# Patient Record
Sex: Female | Born: 1955 | Race: White | Hispanic: Yes | Marital: Married | Smoking: Former smoker
Health system: Southern US, Community
[De-identification: ages and names within clinical notes are randomized; demographics above are authoritative.]

## PROBLEM LIST (undated history)

## (undated) DIAGNOSIS — C44310 Basal cell carcinoma of skin of unspecified parts of face: Secondary | ICD-10-CM

## (undated) DIAGNOSIS — F411 Generalized anxiety disorder: Secondary | ICD-10-CM

## (undated) DIAGNOSIS — M8580 Other specified disorders of bone density and structure, unspecified site: Secondary | ICD-10-CM

## (undated) DIAGNOSIS — I10 Essential (primary) hypertension: Secondary | ICD-10-CM

## (undated) DIAGNOSIS — E785 Hyperlipidemia, unspecified: Secondary | ICD-10-CM

## (undated) DIAGNOSIS — G500 Trigeminal neuralgia: Secondary | ICD-10-CM

## (undated) DIAGNOSIS — E559 Vitamin D deficiency, unspecified: Secondary | ICD-10-CM

## (undated) HISTORY — PX: CATARACT EXTRACTION, BILATERAL: SHX1313

## (undated) HISTORY — DX: Essential (primary) hypertension: I10

## (undated) HISTORY — DX: Other specified disorders of bone density and structure, unspecified site: M85.80

## (undated) HISTORY — DX: Vitamin D deficiency, unspecified: E55.9

## (undated) HISTORY — DX: Hyperlipidemia, unspecified: E78.5

## (undated) HISTORY — DX: Trigeminal neuralgia: G50.0

## (undated) HISTORY — DX: Generalized anxiety disorder: F41.1

## (undated) HISTORY — PX: REDUCTION MAMMAPLASTY: SUR839

## (undated) HISTORY — DX: Basal cell carcinoma of skin of unspecified parts of face: C44.310

---

## 1997-06-29 HISTORY — PX: REDUCTION MAMMAPLASTY: SUR839

## 2005-06-29 HISTORY — PX: TOTAL ABDOMINAL HYSTERECTOMY: SHX209

## 2017-12-22 DIAGNOSIS — E782 Mixed hyperlipidemia: Secondary | ICD-10-CM | POA: Insufficient documentation

## 2018-02-17 LAB — HM HEPATITIS C SCREENING LAB: HM Hepatitis Screen: NEGATIVE

## 2018-03-28 ENCOUNTER — Other Ambulatory Visit: Payer: Self-pay | Admitting: Nurse Practitioner

## 2018-03-28 DIAGNOSIS — Z1231 Encounter for screening mammogram for malignant neoplasm of breast: Secondary | ICD-10-CM

## 2018-04-07 DIAGNOSIS — G5601 Carpal tunnel syndrome, right upper limb: Secondary | ICD-10-CM | POA: Insufficient documentation

## 2018-05-02 ENCOUNTER — Ambulatory Visit
Admission: RE | Admit: 2018-05-02 | Discharge: 2018-05-02 | Disposition: A | Discharge status: Home / Self Care | Payer: 59 | Source: Ambulatory Visit | Attending: Nurse Practitioner | Admitting: Nurse Practitioner

## 2018-05-02 ENCOUNTER — Encounter: Payer: Self-pay | Admitting: Radiology

## 2018-05-02 DIAGNOSIS — Z1231 Encounter for screening mammogram for malignant neoplasm of breast: Secondary | ICD-10-CM

## 2018-11-23 DIAGNOSIS — G500 Trigeminal neuralgia: Secondary | ICD-10-CM | POA: Insufficient documentation

## 2018-11-23 DIAGNOSIS — F411 Generalized anxiety disorder: Secondary | ICD-10-CM | POA: Insufficient documentation

## 2018-11-23 DIAGNOSIS — C44311 Basal cell carcinoma of skin of nose: Secondary | ICD-10-CM | POA: Insufficient documentation

## 2019-01-19 LAB — HM COLONOSCOPY

## 2019-08-25 ENCOUNTER — Other Ambulatory Visit: Payer: Self-pay | Admitting: Nurse Practitioner

## 2019-08-25 DIAGNOSIS — Z1231 Encounter for screening mammogram for malignant neoplasm of breast: Secondary | ICD-10-CM

## 2019-09-15 ENCOUNTER — Ambulatory Visit: Payer: Self-pay | Attending: Internal Medicine

## 2019-09-15 DIAGNOSIS — Z23 Encounter for immunization: Secondary | ICD-10-CM

## 2019-09-15 NOTE — Progress Notes (Signed)
   Covid-19 Vaccination Clinic  Name:  Kelsey Humphrey    MRN: [id number] DOB: 04/22/1956  09/15/2019  [name] was observed post Covid-19 immunization for 15 minutes without incident. She was provided with Vaccine Information Sheet and instruction to access the V-Safe system.   [name] was instructed to call 911 with any severe reactions post vaccine: Marland Kitchen Difficulty breathing  . Swelling of face and throat  . A fast heartbeat  . A bad rash all over body  . Dizziness and weakness   Immunizations Administered    Name Date Dose VIS Date Route   Pfizer COVID-19 Vaccine 09/15/2019  4:06 PM 0.3 mL 06/09/2019 Intramuscular   Manufacturer: [place]   Lot: [id number]   [place]: [id number]

## 2019-09-27 ENCOUNTER — Ambulatory Visit: Payer: 59

## 2019-12-26 ENCOUNTER — Ambulatory Visit
Admission: RE | Admit: 2019-12-26 | Discharge: 2019-12-26 | Disposition: A | Discharge status: Home / Self Care | Payer: 59 | Source: Ambulatory Visit | Attending: Nurse Practitioner | Admitting: Nurse Practitioner

## 2019-12-26 ENCOUNTER — Other Ambulatory Visit: Payer: Self-pay

## 2019-12-26 DIAGNOSIS — Z1231 Encounter for screening mammogram for malignant neoplasm of breast: Secondary | ICD-10-CM

## 2019-12-29 ENCOUNTER — Other Ambulatory Visit: Payer: Self-pay | Admitting: Nurse Practitioner

## 2019-12-29 DIAGNOSIS — R928 Other abnormal and inconclusive findings on diagnostic imaging of breast: Secondary | ICD-10-CM

## 2020-01-03 ENCOUNTER — Other Ambulatory Visit: Payer: Self-pay

## 2020-01-03 ENCOUNTER — Ambulatory Visit
Admission: RE | Admit: 2020-01-03 | Discharge: 2020-01-03 | Disposition: A | Discharge status: Home / Self Care | Payer: 59 | Source: Ambulatory Visit | Attending: Nurse Practitioner | Admitting: Nurse Practitioner

## 2020-01-03 DIAGNOSIS — R928 Other abnormal and inconclusive findings on diagnostic imaging of breast: Secondary | ICD-10-CM

## 2020-01-08 ENCOUNTER — Other Ambulatory Visit: Payer: Self-pay | Admitting: Nurse Practitioner

## 2020-01-08 DIAGNOSIS — M8580 Other specified disorders of bone density and structure, unspecified site: Secondary | ICD-10-CM

## 2020-01-08 DIAGNOSIS — E2839 Other primary ovarian failure: Secondary | ICD-10-CM

## 2020-01-23 ENCOUNTER — Ambulatory Visit
Admission: RE | Admit: 2020-01-23 | Discharge: 2020-01-23 | Disposition: A | Discharge status: Home / Self Care | Payer: 59 | Source: Ambulatory Visit | Attending: Nurse Practitioner | Admitting: Nurse Practitioner

## 2020-01-23 ENCOUNTER — Other Ambulatory Visit: Payer: Self-pay

## 2020-01-23 DIAGNOSIS — E2839 Other primary ovarian failure: Secondary | ICD-10-CM

## 2020-01-23 DIAGNOSIS — M8580 Other specified disorders of bone density and structure, unspecified site: Secondary | ICD-10-CM

## 2020-02-07 ENCOUNTER — Other Ambulatory Visit: Payer: 59

## 2020-11-29 ENCOUNTER — Other Ambulatory Visit: Payer: Self-pay | Admitting: Nurse Practitioner

## 2020-11-29 DIAGNOSIS — N6320 Unspecified lump in the left breast, unspecified quadrant: Secondary | ICD-10-CM

## 2020-12-09 LAB — HM MAMMOGRAPHY

## 2021-01-10 ENCOUNTER — Other Ambulatory Visit: Payer: 59

## 2021-07-12 IMAGING — MG DIGITAL SCREENING BILAT W/ TOMO W/ CAD
8 series · 9 of 24 positions shown · non-contrast
Comparison: Previous exam(s).

CLINICAL DATA: Screening.

EXAM:
DIGITAL SCREENING BILATERAL MAMMOGRAM WITH TOMO AND CAD

[R CC synth-2D]
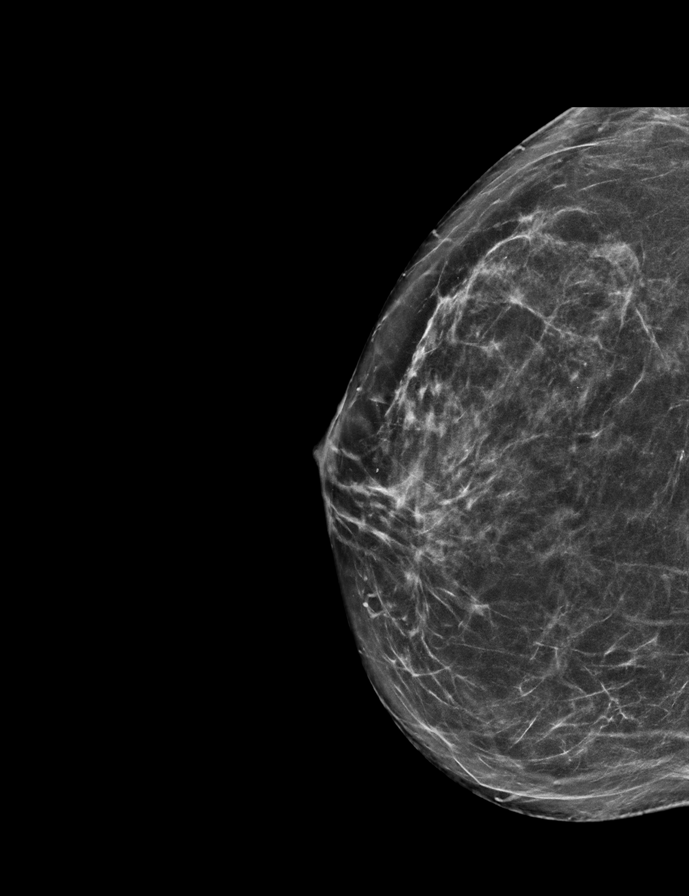

[R MLO synth-2D]
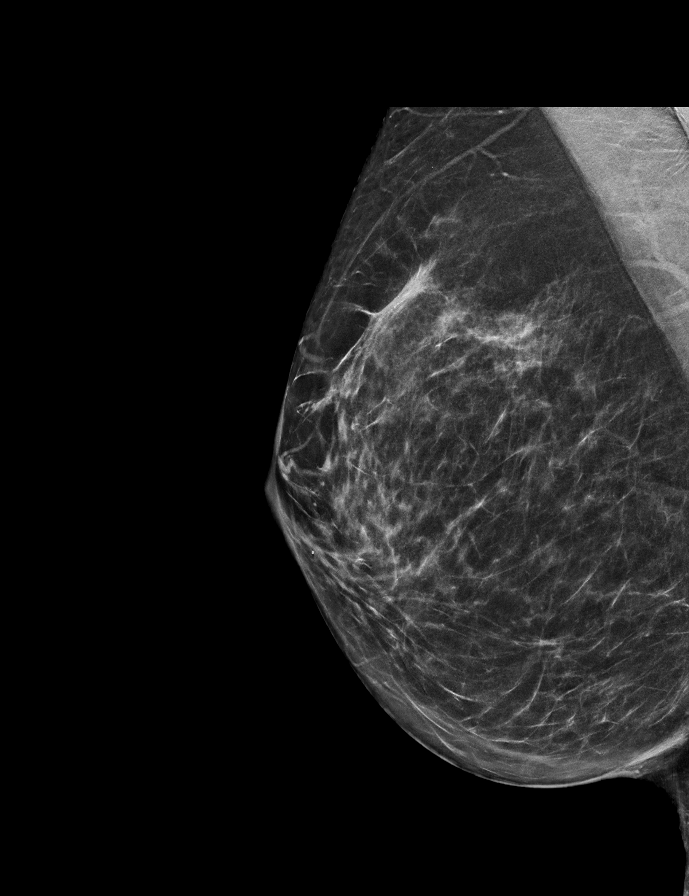

[L MLO synth-2D]
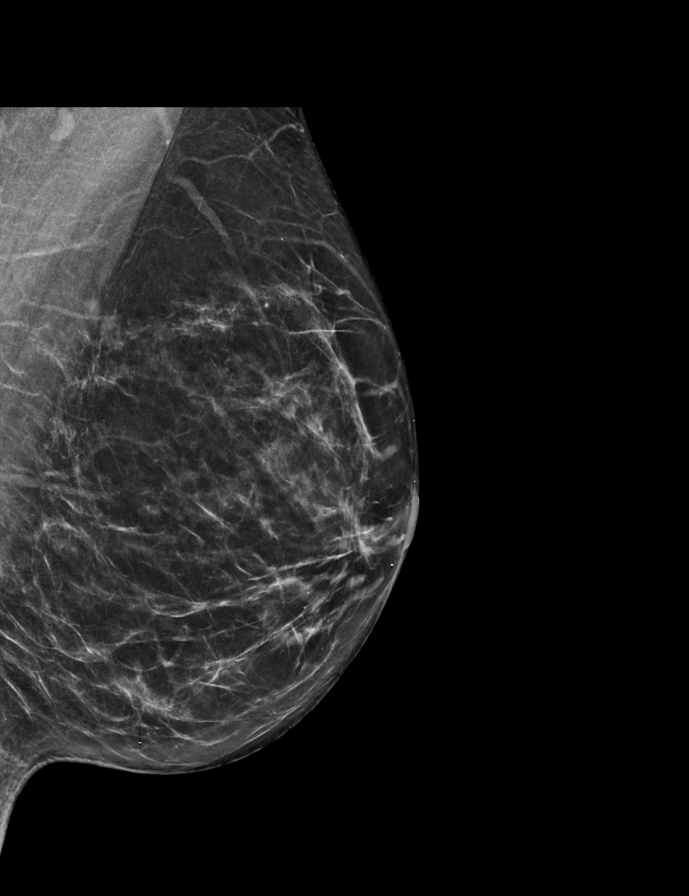

[L CC synth-2D]
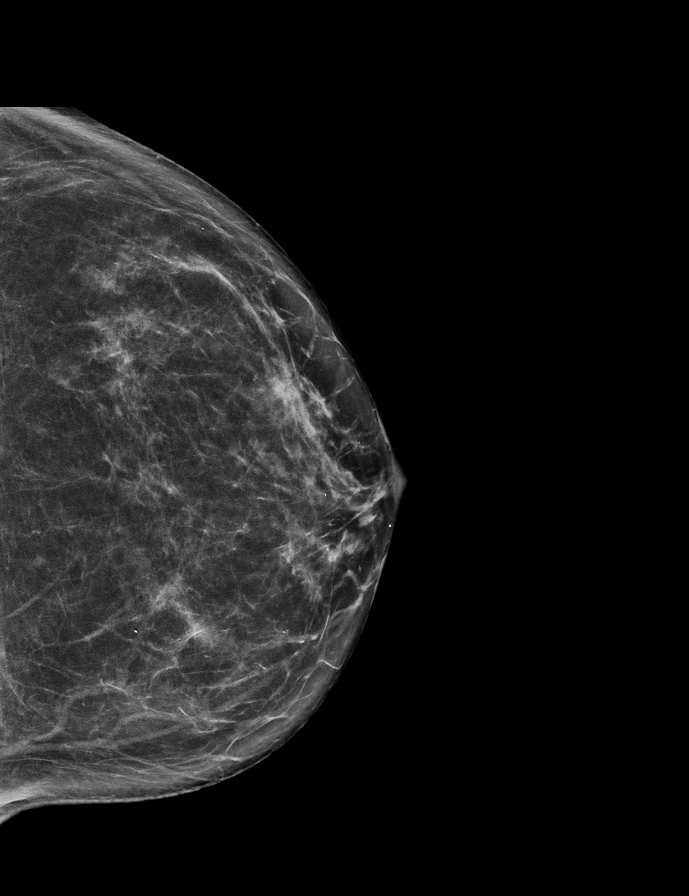

[R MLO tomo · 2 of 61 frames shown]
[frame 20/61]
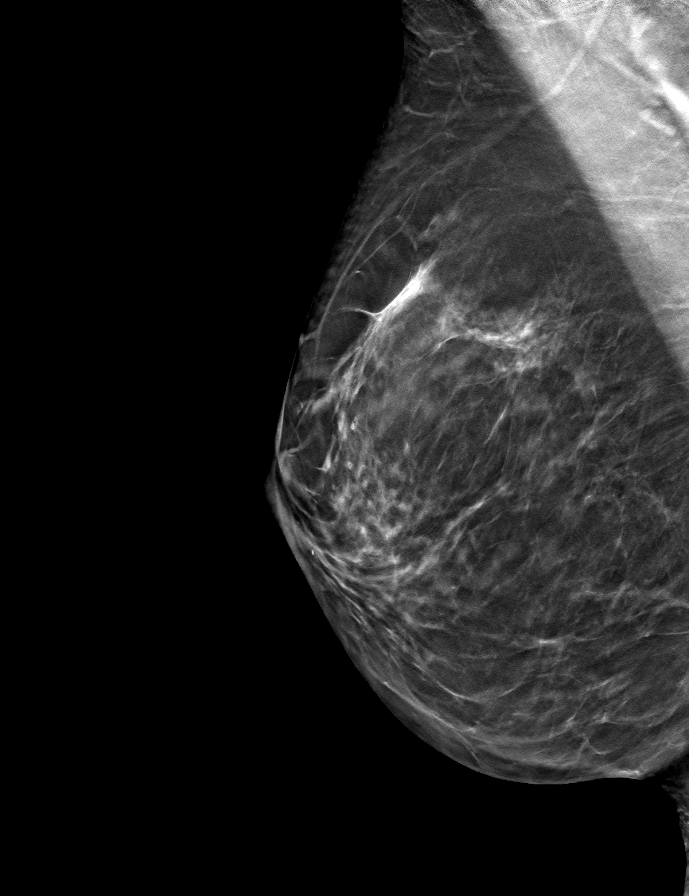
[frame 31/61]
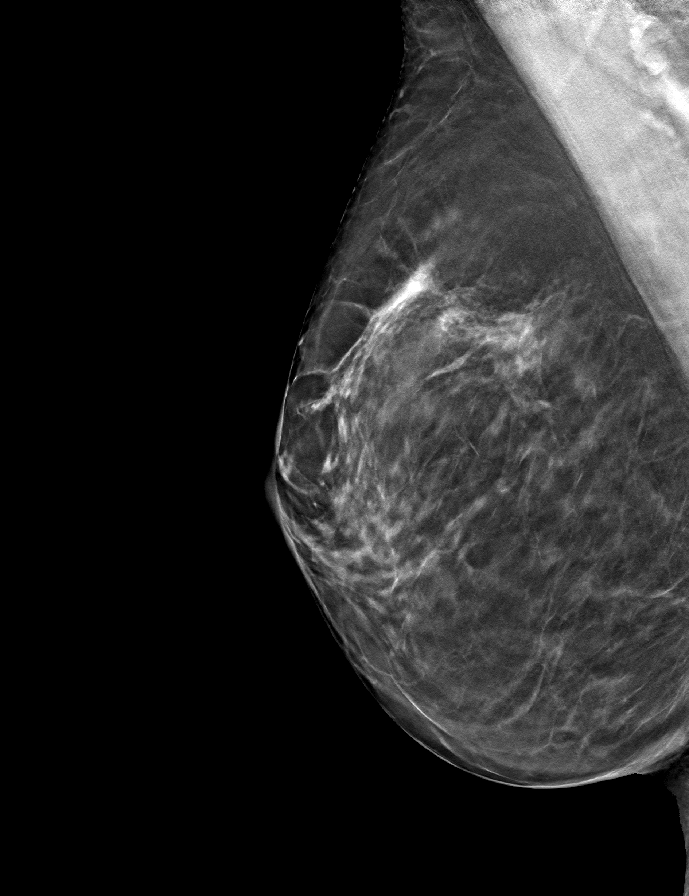

[L CC tomo · tomo slice 33/64.0]
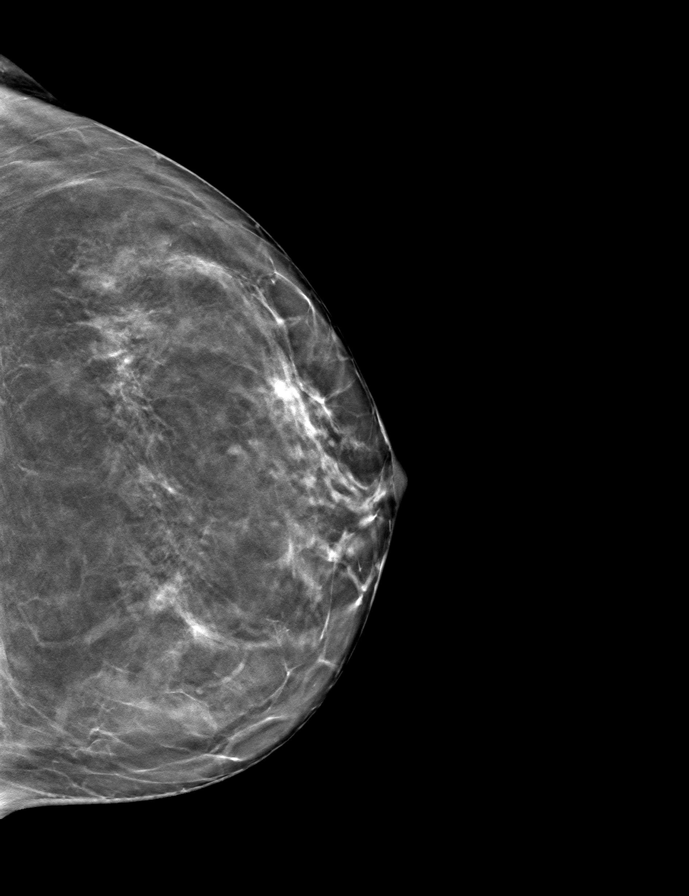

[R CC tomo · tomo slice 33/66.0]
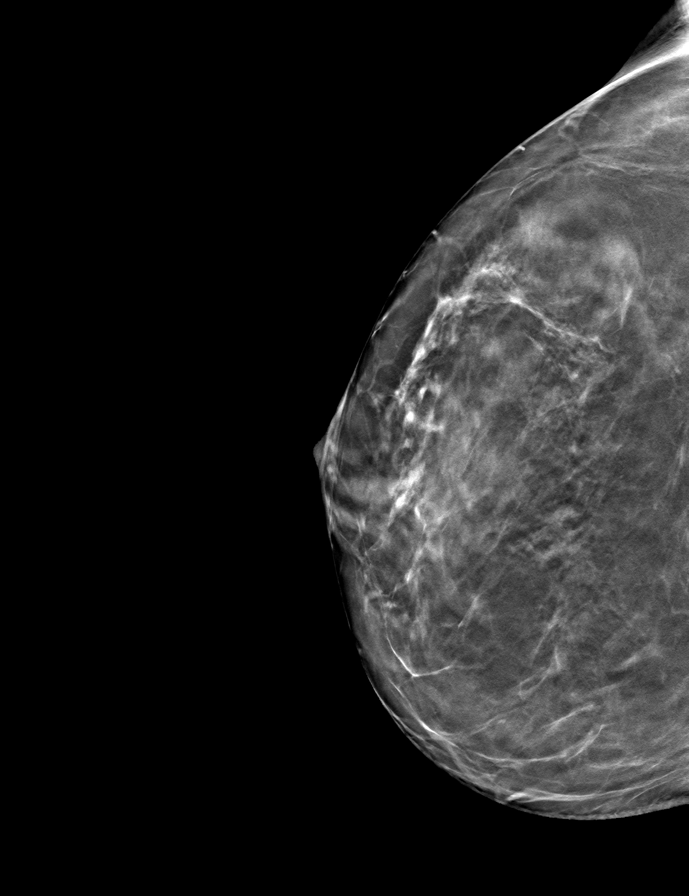

[L MLO tomo · tomo slice 31/62.0]
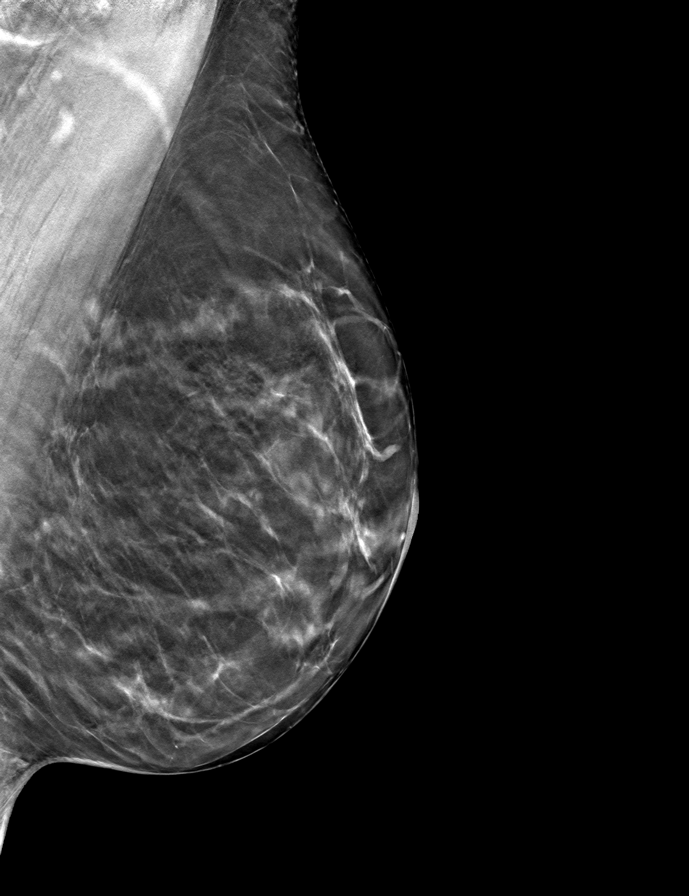

[9 of 24 positions shown; findings below may reference images not displayed]

ACR Breast Density Category b: There are scattered areas of
fibroglandular density.
FINDINGS: In the left breast, a possible mass warrants further evaluation. In
the right breast, no findings suspicious for malignancy.

Images were processed with CAD.
IMPRESSION: Further evaluation is suggested for possible mass in the left
breast.

RECOMMENDATION:
Diagnostic mammogram and possibly ultrasound of the left breast.
(Code:JC-[id number])

The patient will be contacted regarding the findings, and additional
imaging will be scheduled.

BI-RADS CATEGORY  0: Incomplete. Need additional imaging evaluation
and/or prior mammograms for comparison.

## 2021-12-01 ENCOUNTER — Encounter: Payer: Self-pay | Admitting: Nurse Practitioner

## 2021-12-01 ENCOUNTER — Telehealth: Payer: Self-pay | Admitting: Internal Medicine

## 2021-12-01 DIAGNOSIS — E559 Vitamin D deficiency, unspecified: Secondary | ICD-10-CM | POA: Insufficient documentation

## 2021-12-01 NOTE — Telephone Encounter (Signed)
Pt stated her and her husband Rosaria Ferries met Dr.Paz at  friend's house and would like to establish care with him. Please advise.

## 2021-12-01 NOTE — Telephone Encounter (Signed)
Okay to schedule NP appt please.

## 2021-12-01 NOTE — Telephone Encounter (Signed)
Is okay, please arrange.  thanks

## 2021-12-01 NOTE — Telephone Encounter (Signed)
Please advise 

## 2021-12-02 ENCOUNTER — Encounter: Payer: Self-pay | Admitting: Nurse Practitioner

## 2021-12-19 ENCOUNTER — Other Ambulatory Visit: Payer: Self-pay | Admitting: Nurse Practitioner

## 2021-12-19 DIAGNOSIS — Z1231 Encounter for screening mammogram for malignant neoplasm of breast: Secondary | ICD-10-CM

## 2021-12-31 ENCOUNTER — Ambulatory Visit: Payer: Commercial Managed Care - HMO | Admitting: Internal Medicine

## 2021-12-31 ENCOUNTER — Encounter: Payer: Self-pay | Admitting: Internal Medicine

## 2021-12-31 VITALS — BP 98/68 | HR 95 | Temp 97.8°F | Resp 16 | Ht 62.0 in | Wt 135.8 lb

## 2021-12-31 DIAGNOSIS — F411 Generalized anxiety disorder: Secondary | ICD-10-CM

## 2021-12-31 DIAGNOSIS — K5909 Other constipation: Secondary | ICD-10-CM

## 2021-12-31 DIAGNOSIS — G500 Trigeminal neuralgia: Secondary | ICD-10-CM

## 2021-12-31 DIAGNOSIS — Z124 Encounter for screening for malignant neoplasm of cervix: Secondary | ICD-10-CM

## 2021-12-31 DIAGNOSIS — E782 Mixed hyperlipidemia: Secondary | ICD-10-CM

## 2021-12-31 DIAGNOSIS — E559 Vitamin D deficiency, unspecified: Secondary | ICD-10-CM | POA: Diagnosis not present

## 2021-12-31 DIAGNOSIS — Z1231 Encounter for screening mammogram for malignant neoplasm of breast: Secondary | ICD-10-CM

## 2021-12-31 MED ORDER — CITALOPRAM HYDROBROMIDE 20 MG PO TABS: 20.0000 mg | ORAL_TABLET | Freq: Every day | ORAL | 1 refills | Status: DC

## 2021-12-31 NOTE — Patient Instructions (Addendum)
Continue same medications  Start Metamucil 1 capsule daily  Okay to take MiraLAX 17 g with fluids daily if needed  Vitamin D: 2000 units daily  We are referring you for a mammogram at [facility]  We are referring you to a gynecologist in this building.  Vaccines I recommend: Shingrix COVID booster Flu shot every fall       GO TO THE FRONT DESK, [place] back for a physical exam in 4 months

## 2021-12-31 NOTE — Progress Notes (Addendum)
Subjective:    Patient ID: Charleston Vierling, female    DOB: April 20, 1956, 66 y.o.   MRN: [id number]  DOS:  12/31/2021 Type of visit - description: New patient, to get established  In general feeling well. Has a long history of constipation.  MiraLAX okay?.  Review of Systems Other than above, no major concerns  Past Medical History:  Diagnosis Date   BCC (basal cell carcinoma), face    GAD (generalized anxiety disorder)    Hyperlipidemia    Osteopenia    Trigeminal neuralgia    Vitamin D deficiency     Past Surgical History:  Procedure Laterality Date   CATARACT EXTRACTION, BILATERAL Bilateral    CESAREAN SECTION  1990   REDUCTION MAMMAPLASTY Bilateral 1999   TOTAL ABDOMINAL HYSTERECTOMY  2007   Social History   Socioeconomic History   Marital status: Married    Spouse name: Not on file   Number of children: 2   Years of education: Not on file   Highest education level: Not on file  Occupational History   Occupation: retired - Press photographer  Tobacco Use   Smoking status: Former    Types: Cigarettes   Smokeless tobacco: Never   Tobacco comments:    Remote smoker , << 1 ppd   Substance and Sexual Activity   Alcohol use: Yes   Drug use: Not on file   Sexual activity: Not on file  Other Topics Concern   Not on file  Social History Narrative   Original from [place], moves to the [place] 1980s   2 adult children   Social Determinants of Health   Financial Resource Strain: Not on file  Food Insecurity: Not on file  Transportation Needs: Not on file  Physical Activity: Not on file  Stress: Not on file  Social Connections: Not on file  Intimate Partner Violence: Not on file   Family History  Problem Relation Age of Onset   Cerebral aneurysm Mother    Throat cancer Father    Breast cancer Neg Hx    Colon cancer Neg Hx      Current Outpatient Medications  Medication Instructions   atorvastatin (LIPITOR) 40 MG tablet 1 tablet, Oral, Daily   Calcium 600-5 MG-MCG  TABS Oral   citalopram (CELEXA) 20 mg, Oral, Daily   clorazepate (TRANXENE) 7.5 MG tablet 1 tablet, Oral, 2 times daily PRN   estradiol (ESTRACE) 0.1 MG/GM vaginal cream Vaginal   gabapentin (NEURONTIN) 100 MG capsule 1 capsule, Oral, 3 times daily   vitamin C 1,000 mg, Oral, Daily       Objective:   Physical Exam BP 98/68 (BP Location: Left Arm, Patient Position: Sitting, Cuff Size: Normal)   Pulse 95   Temp 97.8 F (36.6 C) (Oral)   Resp 16   Ht '5\' 2"'$  (1.575 m)   Wt 135 lb 12.8 oz (61.6 kg)   SpO2 96%   BMI 24.84 kg/m  General: Well developed, NAD, BMI noted Neck: No  thyromegaly  HEENT:  Normocephalic . Face symmetric, atraumatic Lungs:  CTA B Normal respiratory effort, no intercostal retractions, no accessory muscle use. Heart: RRR,  no murmur.  Abdomen:  Not distended, soft, non-tender. No rebound or rigidity.   Lower extremities: no pretibial edema bilaterally  Skin: Exposed areas without rash. Not pale. Not jaundice Neurologic:  alert & oriented X3.  Speech normal, gait appropriate for age and unassisted Strength symmetric and appropriate for age.  Psych: Cognition and judgment appear intact.  Cooperative  with normal attention span and concentration.  Behavior appropriate. No anxious or depressed appearing.     Assessment    ASSESSMENT High cholesterol Anxiety  HAs, trigeminal neuralgia (per OV note 03/15/2020)- gabapentin Osteopenia Low Vit D Chronic constipation (GI Dr Benson [place], Rx Linzess) Menopausal BCC  sees derm    PLAN New patient, here to get established High cholesterol: On atorvastatin, last LDL 83 in November 2022 Anxiety: Symptoms well controlled. - Refill citalopram today. - Refill Tranxene when needed, PDMP checked today and okay. Chronic constipation: At some point took [place] but now price is extremely high.  We talk about what hydration, Metamucil and okay to take MiraLAX daily if needed. H/o headaches, trigeminal neuralgia: Good  response to gabapentin, refill as needed. H/o low vitamin D, on supplements, last vitamin D 02-2020 was okay. Osteopenia: On vitamin D, reassess on RTC. Preventive care: Had Shingrix No. 1, rec to get 2nd shot @ the pharmacy Mammogram order sent ([facility]) Refer to gynecology. Flu and COVID-vaccine recommended.   Had a colonoscopy few years ago, next in 7 years, per patient she will be due ~ 2027. RTC 4 months CPX  Time spent reviewing the chart (multiple records on Care Everywhere) 35 minutes.

## 2022-01-01 ENCOUNTER — Encounter: Payer: Self-pay | Admitting: Internal Medicine

## 2022-01-01 DIAGNOSIS — Z09 Encounter for follow-up examination after completed treatment for conditions other than malignant neoplasm: Secondary | ICD-10-CM | POA: Insufficient documentation

## 2022-01-01 DIAGNOSIS — Z85828 Personal history of other malignant neoplasm of skin: Secondary | ICD-10-CM | POA: Insufficient documentation

## 2022-01-01 DIAGNOSIS — K5909 Other constipation: Secondary | ICD-10-CM | POA: Insufficient documentation

## 2022-01-01 NOTE — Assessment & Plan Note (Signed)
New patient, here to get established High cholesterol: On atorvastatin, last LDL 83 in November 2022 Anxiety: Symptoms well controlled. - Refill citalopram today. - Refill Tranxene when needed, PDMP checked today and okay. Chronic constipation: At some point took [place] but now price is extremely high.  We talk about what hydration, Metamucil and okay to take MiraLAX daily if needed. H/o headaches, trigeminal neuralgia: Good response to gabapentin, refill as needed. H/o low vitamin D, on supplements, last vitamin D 02-2020 was okay. Osteopenia: On vitamin D, reassess on RTC. Preventive care: Had Shingrix No. 1, rec to get 2nd shot @ the pharmacy Mammogram order sent ([facility]) Refer to gynecology. Flu and COVID-vaccine recommended.   Had a colonoscopy few years ago, next in 7 years, per patient she will be due ~ 2027. RTC 4 months CPX

## 2022-01-08 ENCOUNTER — Other Ambulatory Visit (HOSPITAL_COMMUNITY): Payer: Self-pay | Admitting: Nurse Practitioner

## 2022-01-08 DIAGNOSIS — Z1231 Encounter for screening mammogram for malignant neoplasm of breast: Secondary | ICD-10-CM

## 2022-01-19 ENCOUNTER — Ambulatory Visit (HOSPITAL_COMMUNITY): Payer: Commercial Managed Care - HMO

## 2022-01-22 ENCOUNTER — Other Ambulatory Visit (HOSPITAL_COMMUNITY): Payer: Self-pay | Admitting: Nurse Practitioner

## 2022-01-22 ENCOUNTER — Inpatient Hospital Stay
Admission: RE | Admit: 2022-01-22 | Discharge: 2022-01-22 | Disposition: A | Discharge status: Home / Self Care | Payer: Self-pay | Source: Ambulatory Visit | Attending: Nurse Practitioner | Admitting: Nurse Practitioner

## 2022-01-22 ENCOUNTER — Ambulatory Visit
Admission: RE | Admit: 2022-01-22 | Discharge: 2022-01-22 | Disposition: A | Discharge status: Home / Self Care | Payer: Self-pay | Source: Ambulatory Visit | Attending: Nurse Practitioner | Admitting: Nurse Practitioner

## 2022-01-22 DIAGNOSIS — Z1231 Encounter for screening mammogram for malignant neoplasm of breast: Secondary | ICD-10-CM

## 2022-01-23 ENCOUNTER — Telehealth: Payer: Self-pay | Admitting: Internal Medicine

## 2022-01-23 DIAGNOSIS — M8580 Other specified disorders of bone density and structure, unspecified site: Secondary | ICD-10-CM

## 2022-01-23 NOTE — Telephone Encounter (Signed)
Please advise? She is scheduled for mammogram on 01/28/22- she would like to try to do bone density at the same time.

## 2022-01-23 NOTE — Telephone Encounter (Signed)
Patient called to request order for bone density test. She wants to try to schedule them at the same time. Please call patient to advise.

## 2022-01-23 NOTE — Telephone Encounter (Signed)
Last DEXA 2 years ago, okay to add a bone density takes, DX osteopenia

## 2022-01-23 NOTE — Telephone Encounter (Signed)
Order placed to [facility].

## 2022-01-28 ENCOUNTER — Ambulatory Visit (HOSPITAL_COMMUNITY)
Admission: RE | Admit: 2022-01-28 | Discharge: 2022-01-28 | Disposition: A | Discharge status: Home / Self Care | Payer: Commercial Managed Care - HMO | Source: Ambulatory Visit | Attending: Internal Medicine | Admitting: Internal Medicine

## 2022-01-28 ENCOUNTER — Ambulatory Visit (HOSPITAL_COMMUNITY)
Admission: RE | Admit: 2022-01-28 | Discharge: 2022-01-28 | Disposition: A | Discharge status: Home / Self Care | Payer: Commercial Managed Care - HMO | Source: Ambulatory Visit | Attending: Nurse Practitioner | Admitting: Nurse Practitioner

## 2022-01-28 DIAGNOSIS — Z1231 Encounter for screening mammogram for malignant neoplasm of breast: Secondary | ICD-10-CM | POA: Insufficient documentation

## 2022-01-28 DIAGNOSIS — M8580 Other specified disorders of bone density and structure, unspecified site: Secondary | ICD-10-CM | POA: Insufficient documentation

## 2022-01-29 ENCOUNTER — Other Ambulatory Visit (HOSPITAL_COMMUNITY): Payer: Commercial Managed Care - HMO

## 2022-01-30 ENCOUNTER — Other Ambulatory Visit (HOSPITAL_COMMUNITY): Payer: Self-pay | Admitting: Nurse Practitioner

## 2022-02-03 ENCOUNTER — Other Ambulatory Visit (HOSPITAL_COMMUNITY): Payer: Self-pay | Admitting: Nurse Practitioner

## 2022-02-05 ENCOUNTER — Other Ambulatory Visit (HOSPITAL_COMMUNITY): Payer: Self-pay | Admitting: Nurse Practitioner

## 2022-02-05 DIAGNOSIS — R928 Other abnormal and inconclusive findings on diagnostic imaging of breast: Secondary | ICD-10-CM

## 2022-02-25 ENCOUNTER — Encounter (HOSPITAL_COMMUNITY): Payer: Commercial Managed Care - HMO

## 2022-02-25 ENCOUNTER — Encounter (HOSPITAL_COMMUNITY): Payer: Self-pay

## 2022-02-25 ENCOUNTER — Other Ambulatory Visit (HOSPITAL_COMMUNITY): Payer: Commercial Managed Care - HMO

## 2022-02-25 ENCOUNTER — Ambulatory Visit (HOSPITAL_COMMUNITY): Payer: Commercial Managed Care - HMO

## 2022-03-13 ENCOUNTER — Telehealth: Payer: Self-pay | Admitting: Internal Medicine

## 2022-03-13 NOTE — Telephone Encounter (Signed)
OV note faxed. I have asked for their notes and Pt's cscope report in return.

## 2022-03-13 NOTE — Telephone Encounter (Signed)
Lattie Haw with [facility] calling to get last office visit note for patient with Dr. Larose Kells faxed to [phone].

## 2022-03-25 ENCOUNTER — Encounter: Payer: Self-pay | Admitting: Internal Medicine

## 2022-03-26 ENCOUNTER — Encounter: Payer: Self-pay | Admitting: Internal Medicine

## 2022-03-27 MED ORDER — GABAPENTIN 100 MG PO CAPS: 100.0000 mg | ORAL_CAPSULE | Freq: Three times a day (TID) | ORAL | 1 refills | Status: DC

## 2022-03-27 MED ORDER — CLORAZEPATE DIPOTASSIUM 7.5 MG PO TABS: 7.5000 mg | ORAL_TABLET | Freq: Two times a day (BID) | ORAL | 0 refills | Status: DC | PRN

## 2022-03-27 MED ORDER — ATORVASTATIN CALCIUM 40 MG PO TABS: 40.0000 mg | ORAL_TABLET | Freq: Every day | ORAL | 1 refills | Status: DC

## 2022-03-27 NOTE — Telephone Encounter (Signed)
I have pended the clorazepate prescription. I don't see where you have ever written it for this Pt. Please advise.

## 2022-03-27 NOTE — Telephone Encounter (Signed)
PDMP okay, Rx sent 

## 2022-03-30 ENCOUNTER — Encounter: Payer: Self-pay | Admitting: Internal Medicine

## 2022-05-12 ENCOUNTER — Encounter (HOSPITAL_BASED_OUTPATIENT_CLINIC_OR_DEPARTMENT_OTHER): Payer: Commercial Managed Care - HMO | Admitting: Medical

## 2022-05-14 ENCOUNTER — Encounter: Payer: Commercial Managed Care - HMO | Admitting: Internal Medicine

## 2022-07-27 ENCOUNTER — Ambulatory Visit: Payer: Commercial Managed Care - HMO | Admitting: Internal Medicine

## 2022-08-01 ENCOUNTER — Telehealth: Payer: Self-pay | Admitting: Internal Medicine

## 2022-08-02 ENCOUNTER — Encounter: Payer: Self-pay | Admitting: Internal Medicine

## 2022-08-03 NOTE — Telephone Encounter (Signed)
Requesting: clorazepate 7.'5mg'$   Contract: None UDS: None Last Visit: 12/31/21 Next Visit: 08/11/22 Last Refill: 03/27/22 #90 and 7KC  Will get UDS and contract at next OV  Please Advise

## 2022-08-03 NOTE — Telephone Encounter (Signed)
PDMP okay, Rx sent 

## 2022-08-11 ENCOUNTER — Ambulatory Visit
Admission: RE | Admit: 2022-08-11 | Discharge: 2022-08-11 | Disposition: A | Discharge status: Home / Self Care | Payer: Medicare Other | Source: Ambulatory Visit | Attending: Nurse Practitioner | Admitting: Nurse Practitioner

## 2022-08-11 ENCOUNTER — Encounter: Payer: Self-pay | Admitting: Internal Medicine

## 2022-08-11 ENCOUNTER — Ambulatory Visit (INDEPENDENT_AMBULATORY_CARE_PROVIDER_SITE_OTHER): Payer: Medicare Other | Admitting: Internal Medicine

## 2022-08-11 ENCOUNTER — Ambulatory Visit
Admission: RE | Admit: 2022-08-11 | Discharge: 2022-08-11 | Disposition: A | Discharge status: Home / Self Care | Payer: PRIVATE HEALTH INSURANCE | Source: Ambulatory Visit | Attending: Nurse Practitioner | Admitting: Nurse Practitioner

## 2022-08-11 VITALS — BP 108/62 | HR 58 | Temp 98.0°F | Resp 16 | Ht 62.0 in | Wt 137.0 lb

## 2022-08-11 DIAGNOSIS — R928 Other abnormal and inconclusive findings on diagnostic imaging of breast: Secondary | ICD-10-CM

## 2022-08-11 DIAGNOSIS — M25572 Pain in left ankle and joints of left foot: Secondary | ICD-10-CM

## 2022-08-11 DIAGNOSIS — E559 Vitamin D deficiency, unspecified: Secondary | ICD-10-CM | POA: Diagnosis not present

## 2022-08-11 DIAGNOSIS — M25571 Pain in right ankle and joints of right foot: Secondary | ICD-10-CM

## 2022-08-11 DIAGNOSIS — E782 Mixed hyperlipidemia: Secondary | ICD-10-CM

## 2022-08-11 DIAGNOSIS — Z124 Encounter for screening for malignant neoplasm of cervix: Secondary | ICD-10-CM

## 2022-08-11 DIAGNOSIS — K5909 Other constipation: Secondary | ICD-10-CM | POA: Diagnosis not present

## 2022-08-11 DIAGNOSIS — Z23 Encounter for immunization: Secondary | ICD-10-CM | POA: Diagnosis not present

## 2022-08-11 LAB — COMPREHENSIVE METABOLIC PANEL WITH GFR
ALT: 24 U/L (ref 0–35)
AST: 25 U/L (ref 0–37)
Albumin: 4.3 g/dL (ref 3.5–5.2)
Alkaline Phosphatase: 60 U/L (ref 39–117)
BUN: 18 mg/dL (ref 6–23)
CO2: 31 meq/L (ref 19–32)
Calcium: 9.5 mg/dL (ref 8.4–10.5)
Chloride: 101 meq/L (ref 96–112)
Creatinine, Ser: 0.93 mg/dL (ref 0.40–1.20)
GFR: 64.7 mL/min
Glucose, Bld: 82 mg/dL (ref 70–99)
Potassium: 4.8 meq/L (ref 3.5–5.1)
Sodium: 138 meq/L (ref 135–145)
Total Bilirubin: 0.8 mg/dL (ref 0.2–1.2)
Total Protein: 6.5 g/dL (ref 6.0–8.3)

## 2022-08-11 LAB — LIPID PANEL
Cholesterol: 185 mg/dL (ref 0–200)
HDL: 76.7 mg/dL (ref >39.00)
LDL Cholesterol: 94 mg/dL (ref 0–99)
NonHDL: 108.13
Total CHOL/HDL Ratio: 2
Triglycerides: 72 mg/dL (ref 0.0–149.0)
VLDL: 14.4 mg/dL (ref 0.0–40.0)

## 2022-08-11 LAB — CBC WITH DIFFERENTIAL/PLATELET
Basophils Absolute: 0 10*3/uL (ref 0.0–0.1)
Basophils Relative: 0.7 % (ref 0.0–3.0)
Eosinophils Absolute: 0.1 10*3/uL (ref 0.0–0.7)
Eosinophils Relative: 1.6 % (ref 0.0–5.0)
HCT: 37.8 % (ref 36.0–46.0)
Hemoglobin: 12.5 g/dL (ref 12.0–15.0)
Lymphocytes Relative: 20.7 % (ref 12.0–46.0)
Lymphs Abs: 1.2 10*3/uL (ref 0.7–4.0)
MCHC: 33 g/dL (ref 30.0–36.0)
MCV: 91.4 fl (ref 78.0–100.0)
Monocytes Absolute: 0.6 10*3/uL (ref 0.1–1.0)
Monocytes Relative: 9.7 % (ref 3.0–12.0)
Neutro Abs: 3.9 10*3/uL (ref 1.4–7.7)
Neutrophils Relative %: 67.3 % (ref 43.0–77.0)
Platelets: 300 10*3/uL (ref 150.0–400.0)
RBC: 4.13 Mil/uL (ref 3.87–5.11)
RDW: 13.5 % (ref 11.5–15.5)
WBC: 5.8 10*3/uL (ref 4.0–10.5)

## 2022-08-11 LAB — VITAMIN D 25 HYDROXY (VIT D DEFICIENCY, FRACTURES): VITD: 28.75 ng/mL — ABNORMAL LOW (ref 30.00–100.00)

## 2022-08-11 LAB — TSH: TSH: 1.47 u[IU]/mL (ref 0.35–5.50)

## 2022-08-11 NOTE — Progress Notes (Unsigned)
   Subjective:    Patient ID: Kelsey Humphrey, female    DOB: 1955/10/23, 67 y.o.   MRN: [id number]  DOS:  08/11/2022 Type of visit - description: rov  Since LOV is doing well. Chronic medical problems evaluated. Reports bilateral ankle pain, mostly around the Achilles tendon, sxs are  every morning, pain is severe at first and as she warms up the pain decreases.  No swelling, redness or warmness. She is very active, denies chest pain or difficulty breathing No GI or GU symptoms.   Review of Systems See above   Past Medical History:  Diagnosis Date   BCC (basal cell carcinoma), face    GAD (generalized anxiety disorder)    Hyperlipidemia    Osteopenia    Trigeminal neuralgia    Vitamin D deficiency     Past Surgical History:  Procedure Laterality Date   CATARACT EXTRACTION, BILATERAL Bilateral    CESAREAN SECTION  1990   REDUCTION MAMMAPLASTY Bilateral 1999   TOTAL ABDOMINAL HYSTERECTOMY  2007    Current Outpatient Medications  Medication Instructions   atorvastatin (LIPITOR) 40 mg, Oral, Daily   Calcium 600-5 MG-MCG TABS Oral   citalopram (CELEXA) 20 mg, Oral, Daily   clorazepate (TRANXENE) 7.5 mg, Oral, 2 times daily PRN   estradiol (ESTRACE) 0.1 MG/GM vaginal cream Vaginal   gabapentin (NEURONTIN) 100 mg, Oral, 3 times daily   vitamin C 1,000 mg, Oral, Daily       Objective:   Physical Exam BP 108/62   Pulse (!) 58   Temp 98 F (36.7 C) (Oral)   Resp 16   Ht '5\' 2"'$  (1.575 m)   Wt 137 lb (62.1 kg)   SpO2 98%   BMI 25.06 kg/m  General: Well developed, NAD, BMI noted Neck: No  thyromegaly  HEENT:  Normocephalic . Face symmetric, atraumatic Lungs:  CTA B Normal respiratory effort, no intercostal retractions, no accessory muscle use. Heart: RRR,  no murmur.  Abdomen:  Not distended, soft, non-tender. No rebound or rigidity.   Lower extremities: no pretibial edema bilaterally.  High arch noted, no TTP at the heel area, slightly TTP at the distal Achilles  tendon Skin: Exposed areas without rash. Not pale. Not jaundice Neurologic:  alert & oriented X3.  Speech normal, gait appropriate for age and unassisted Strength symmetric and appropriate for age.  Psych: Cognition and judgment appear intact.  Cooperative with normal attention span and concentration.  Behavior appropriate. No anxious or depressed appearing.     Assessment   ASSESSMENT High cholesterol Anxiety  HAs, trigeminal neuralgia (per OV note 03/15/2020)- gabapentin Osteopenia Low Vit D Chronic constipation (GI Dr Benson [place], Rx Linzess) Menopausal BCC  sees derm  Osteopenia, T-score -1.6 (August 2023)  PLAN High cholesterol: On Lipitor, checking labs. Anxiety: Well-controlled on citalopram and Tranxene.  UDS and contract today. Headaches, trigeminal neuralgia: Continue gabapentin Low vitamin D, on supplements, checking levels. Chronic constipation: Since the last visit, is taking MiraLAX prn and plans to start fiber supplements. BCC: Reports she saw dermatology 06-2022. Osteopenia: She is active, plays pickle ball daily, on calcium 600 mg and vitamin D supplements. Ankle pain bilaterally: As described above, refer to sports medicine.  Noted high arch, shoe inserts? Preventive care reviewed RTC 6 months

## 2022-08-11 NOTE — Progress Notes (Unsigned)
I,Nikolos Billig,acting as a scribe for Lynne Leader, MD.,have documented all relevant documentation on the behalf of Lynne Leader, MD,as directed by  Lynne Leader, MD while in the presence of Lynne Leader, MD.  Subjective:    CC: Bilateral ankle pain  HPI: Patient is a 67 year old female presenting with bilateral ankle pain x 1-2 months.   Pt locates pain to posterior BILAT ankles.  Ankle swelling: pt denies Aggravates: has started playing pickleball over the past few months. Will have some irritation when running after the ball. Stiffness after prolonged time sitting and with first morning steps.  Treatments tried: ROM in the mornings.  Denies past injury to the achilles or ankle sprains.   Also C/O tightness at posterior LEFT knee.  This is worse with activity.  She notes some pain with rotation while playing pickle ball and athletic activity.  She has a little bit of popping and clicking as well.  Pertinent review of Systems: No fevers or chills  Relevant historical information: Carpal tunnel syndrome right wrist.  Trigeminal neuralgia right side of the face.   Objective:    Vitals:   08/12/22 0902  BP: 102/66  Pulse: 72  SpO2: 99%   General: Well Developed, well nourished, and in no acute distress.   MSK: Right Achilles tendon normal. Tender palpation posterior calcaneus.  Normal foot and ankle motion.  Intact strength.  Left Achilles tendon normal. Tender palpation posterior calcaneus.  Normal foot and ankle motion.  Intact strength.  Left knee: Normal-appearing Nontender. Normal motion and strength.  Some pain with resisted knee extension.  No pain with resisted knee flexion. Positive lateral McMurray's test. Stable ligamentous exam.  Lab and Radiology Results  Diagnostic Limited MSK Ultrasound of: Right Achilles tendon:  No visible tendon tear is present Slightly thickened at tendon insertion with increased vascular activity seen on Doppler. Small retrocalcaneal  bursitis is present. Impression: Achilles tendinitis and retrocalcaneal bursitis  Diagnostic Limited MSK Ultrasound of: Left Achilles tendon Slightly thickened Achilles tendon at insertion. No significant retrocalcaneal bursitis present. No visible tears present. Impression: Achilles tendinitis    X-ray images left knee obtained today personally and independently interpreted Mild patellofemoral DJD.  Minimal medial compartment DJD.  No acute fractures. Await formal radiology review   Impression and Recommendations:    Assessment and Plan: 67 y.o. female with bilateral posterior calcaneus pain thought to be due to Achilles tendinitis and right-sided retrocalcaneal bursitis.  She is a great candidate for trial of conservative management using eccentric exercises.  Home exercise program reviewed with athletic trainer today prior to discharge.  Also recommended night splints for [place] fasciitis which should help.  Additionally she notes some posterior left knee and distal thigh pain.  The pain seems to be concentrated at the posterior lateral corner and could represent a degenerative lateral meniscus tear.  X-ray today is largely benign appearing plan for bit of watchful waiting and consider steroid injection or further evaluation with MRI in the future if needed.  Recheck in 1 month.Marland Kitchen  PDMP not reviewed this encounter. Orders Placed This Encounter  Procedures   [place] LIMITED JOINT SPACE STRUCTURES LOW BILAT    Order Specific Question:   Reason for Exam (SYMPTOM  OR DIAGNOSIS REQUIRED)    Answer:   Bilat achilles pain    Order Specific Question:   Preferred imaging location?    Answer:   [facility]   DG Knee AP/LAT W/[place] Left    Standing Status:   Future  Number of Occurrences:   1    Standing Expiration Date:   09/10/2022    Order Specific Question:   Reason for Exam (SYMPTOM  OR DIAGNOSIS REQUIRED)    Answer:   left knee pain    Order Specific  Question:   Preferred imaging location?    Answer:   [facility]   No orders of the defined types were placed in this encounter.   Discussed warning signs or symptoms. Please see discharge instructions. Patient expresses understanding.   The above documentation has been reviewed and is accurate and complete Lynne Leader, M.D.

## 2022-08-11 NOTE — Patient Instructions (Addendum)
We are referring you to a gynecologist. Please call them at [phone]  vaccines I recommend:  Covid booster Shingrix (shingles) #2 RSV vaccine  Will refer you to sports medicine doctor, if you do not hear from them in few days let us know      GO TO THE LAB : Get the blood work     [place], [place] back for   checkup in 6 months     "[place] of attorney" ,  "Living will" (Advance care planning documents)  If you already have a living will or healthcare power of attorney, is recommended you bring the copy to be scanned in your chart.   The document will be available to all the doctors you see in the system.  Advance care planning is a process that supports adults in  understanding and sharing their preferences regarding future medical care.  The patient's preferences are recorded in documents called Advance Directives and the can be modified at any time while the patient is in full mental capacity.   If you don't have one, please consider create one.      More information at: [email or web address]

## 2022-08-12 ENCOUNTER — Ambulatory Visit (INDEPENDENT_AMBULATORY_CARE_PROVIDER_SITE_OTHER): Payer: Medicare Other

## 2022-08-12 ENCOUNTER — Ambulatory Visit: Payer: Medicare Other

## 2022-08-12 ENCOUNTER — Ambulatory Visit (INDEPENDENT_AMBULATORY_CARE_PROVIDER_SITE_OTHER): Payer: Medicare Other | Admitting: Family Medicine

## 2022-08-12 ENCOUNTER — Encounter: Payer: Self-pay | Admitting: Family Medicine

## 2022-08-12 VITALS — BP 102/66 | HR 72 | Ht 62.0 in | Wt 138.0 lb

## 2022-08-12 DIAGNOSIS — M25562 Pain in left knee: Secondary | ICD-10-CM | POA: Diagnosis not present

## 2022-08-12 DIAGNOSIS — M7660 Achilles tendinitis, unspecified leg: Secondary | ICD-10-CM | POA: Diagnosis not present

## 2022-08-12 DIAGNOSIS — Z0000 Encounter for general adult medical examination without abnormal findings: Secondary | ICD-10-CM | POA: Insufficient documentation

## 2022-08-12 DIAGNOSIS — G8929 Other chronic pain: Secondary | ICD-10-CM

## 2022-08-12 NOTE — Assessment & Plan Note (Signed)
High cholesterol: On Lipitor, checking labs. Anxiety: Well-controlled on citalopram and Tranxene.  UDS and contract today. Headaches, trigeminal neuralgia: Continue gabapentin Low vitamin D, on supplements, checking levels. Chronic constipation: Since the last visit, is taking MiraLAX prn and plans to start fiber supplements. BCC: Reports she saw dermatology 06-2022. Osteopenia: She is active, plays pickle ball daily, on calcium 600 mg and vitamin D supplements. Ankle pain bilaterally: As described above, refer to sports medicine.  Noted high arch, shoe inserts? Preventive care reviewed RTC 6 months

## 2022-08-12 NOTE — Assessment & Plan Note (Signed)
Preventive care reviewed Flu shot today PNM 20: Today RSV recommended COVID-vaccine: rec booster  Recommend Shingrix No. 2 at the pharmacy [facility] 01/28/2022 ([facility]).  Follow-up done today.   Gynecology, pending encouraged to call  Bones: T-score -1.6, August 2023, next 2 to 3 years CCS: C-scope 01/19/2019, follow-up 7 years per pathology report. Healthcare POA: See AVS

## 2022-08-12 NOTE — Patient Instructions (Addendum)
Thank you for coming in today.   Please get an Xray today before you leave   Please complete the exercises that the athletic trainer went over with you:  View at [email or web address] using code: [email or web address]  Night splints  Check back in 1 month

## 2022-08-13 MED ORDER — VITAMIN D (ERGOCALCIFEROL) 1.25 MG (50000 UNIT) PO CAPS: 50000.0000 [IU] | ORAL_CAPSULE | ORAL | 0 refills | Status: DC

## 2022-08-13 NOTE — Addendum Note (Signed)
Addended byDamita Dunnings D on: 08/13/2022 07:51 AM   Modules accepted: Orders

## 2022-08-17 NOTE — Progress Notes (Signed)
Left knee x-ray looks normal to radiology

## 2022-08-31 ENCOUNTER — Encounter: Payer: Commercial Managed Care - HMO | Admitting: Internal Medicine

## 2022-09-09 NOTE — Progress Notes (Deleted)
   I, Kelsey Rowen, PhD, LAT, ATC acting as a scribe for [name], MD.  Kelsey Humphrey is a 67 y.o. female who presents to [facility] [place] today for follow-up bilateral posterior calcaneus pain and left knee pain.  Patient was last seen by Dr. Corey on 08/12/2022 and was taught HEP and advised to use night splints. Plan for watchful waiting w/ L knee. Today, pt reports ***  Dx imaging: 08/12/2022 L knee XR   Pertinent review of systems: ***  Relevant historical information: ***   Exam:  There were no vitals taken for this visit. General: Well Developed, well nourished, and in no acute distress.   MSK: ***    Lab and Radiology Results No results found for this or any previous visit (from the past 72 hour(s)). No results found.     Assessment and Plan: 67 y.o. female with ***   PDMP not reviewed this encounter. No orders of the defined types were placed in this encounter.  No orders of the defined types were placed in this encounter.    Discussed warning signs or symptoms. Please see discharge instructions. Patient expresses understanding.   ***  

## 2022-09-10 ENCOUNTER — Ambulatory Visit: Payer: Medicare Other | Admitting: Family Medicine

## 2022-09-24 NOTE — Progress Notes (Signed)
   Rubin Payor, PhD, LAT, ATC acting as a scribe for Clementeen Graham, MD.  Kelsey Humphrey is a 67 y.o. female who presents to [facility] today for follow-up bilateral posterior calcaneus pain and left knee pain.  Patient was last seen by Dr. Denyse Amass on 08/12/2022 and was taught HEP and advised to use night splints. Plan for watchful waiting w/ L knee. Today, pt reports her feet are feeling a lot better. Pt has been wearing the night splints, but is noncompliant w/ HEP. Her L knee is not bothering her.   Dx imaging: 08/12/2022 L knee XR   Pertinent review of systems: No fevers or chills  Relevant historical information: Trigeminal neuralgia and carpal tunnel syndrome.   Exam:  BP 96/66   Pulse 64   Ht 5\' 2"  (1.575 m)   Wt 137 lb (62.1 kg)   SpO2 98%   BMI 25.06 kg/m  General: Well Developed, well nourished, and in no acute distress.   MSK: Bilateral Achilles tendons are nontender.  Normal foot and ankle motion.  Nonantalgic gait.     Assessment and Plan: 67 y.o. female with significantly improved bilateral Achilles tendinitis.  Pain has not fully resolved but it has significantly improved. Plan to continue home exercise program and continue night splints.  Provided again the handout for the home exercise program.  Check back with me as needed.  Precautions reviewed.    Discussed warning signs or symptoms. Please see discharge instructions. Patient expresses understanding.   The above documentation has been reviewed and is accurate and complete 76, M.D.

## 2022-09-28 ENCOUNTER — Ambulatory Visit (INDEPENDENT_AMBULATORY_CARE_PROVIDER_SITE_OTHER): Payer: Medicare Other | Admitting: Family Medicine

## 2022-09-28 VITALS — BP 96/66 | HR 64 | Ht 62.0 in | Wt 137.0 lb

## 2022-09-28 DIAGNOSIS — M7660 Achilles tendinitis, unspecified leg: Secondary | ICD-10-CM

## 2022-09-28 NOTE — Patient Instructions (Signed)
Thank you for coming in today.   Please complete the exercises that the athletic trainer went over with you:  View at [email or web address] using code: [facility]  Recheck as needed.   There is more to do if needed.

## 2022-10-10 ENCOUNTER — Other Ambulatory Visit: Payer: Self-pay | Admitting: Internal Medicine

## 2022-10-27 ENCOUNTER — Telehealth: Payer: Self-pay | Admitting: Internal Medicine

## 2022-10-27 NOTE — Telephone Encounter (Signed)
Requesting: clorazepate 7.5mg  Contract: None UDS: None Last Visit: 08/11/22 Next Visit: 02/09/23 Last Refill: 08/03/22 #60 and 0RF   Will get UDS and contract at next OV   Please Advise

## 2022-10-27 NOTE — Telephone Encounter (Signed)
Pdmp ok, rx sent  ? ?

## 2022-11-08 ENCOUNTER — Other Ambulatory Visit: Payer: Self-pay | Admitting: Internal Medicine

## 2022-11-24 ENCOUNTER — Telehealth: Payer: Self-pay | Admitting: Internal Medicine

## 2022-11-24 MED ORDER — CITALOPRAM HYDROBROMIDE 20 MG PO TABS: 20.0000 mg | ORAL_TABLET | Freq: Every day | ORAL | 1 refills | Status: DC

## 2022-11-24 NOTE — Telephone Encounter (Signed)
Pt stated she is heading out of town tomorrow (5.29.24) and was wondering about getting this Rx refilled  Prescription Request  11/24/2022  Is this a "Controlled Substance" medicine? No  LOV: 08/11/2022  What is the name of the medication or equipment?   citalopram (CELEXA) 20 MG tablet [[id number]]   Have you contacted your pharmacy to request a refill? No   Which pharmacy would you like this sent to?  [facility] [id number] [address], [place] - [address] [place] [address] Phone: [phone] Fax: [phone]    Patient notified that their request is being sent to the clinical staff for review and that they should receive a response within 2 business days.   Please advise at Mobile [phone] (mobile)

## 2022-11-24 NOTE — Addendum Note (Signed)
Addended byConrad Preble D on: 11/24/2022 12:01 PM   Modules accepted: Orders

## 2022-11-24 NOTE — Telephone Encounter (Signed)
Rx sent 

## 2023-01-07 ENCOUNTER — Encounter (HOSPITAL_BASED_OUTPATIENT_CLINIC_OR_DEPARTMENT_OTHER): Payer: Medicare Other | Admitting: Obstetrics & Gynecology

## 2023-01-27 ENCOUNTER — Encounter (INDEPENDENT_AMBULATORY_CARE_PROVIDER_SITE_OTHER): Payer: Self-pay

## 2023-02-04 ENCOUNTER — Encounter (HOSPITAL_BASED_OUTPATIENT_CLINIC_OR_DEPARTMENT_OTHER): Payer: Self-pay

## 2023-02-04 NOTE — Progress Notes (Unsigned)
Called patient to verify allergies, medication list and medical history. Information was updated. tbw

## 2023-02-09 ENCOUNTER — Ambulatory Visit (INDEPENDENT_AMBULATORY_CARE_PROVIDER_SITE_OTHER): Payer: Medicare Other | Admitting: Obstetrics & Gynecology

## 2023-02-09 ENCOUNTER — Encounter (HOSPITAL_BASED_OUTPATIENT_CLINIC_OR_DEPARTMENT_OTHER): Payer: Self-pay | Admitting: Obstetrics & Gynecology

## 2023-02-09 ENCOUNTER — Ambulatory Visit: Payer: Medicare Other | Admitting: Internal Medicine

## 2023-02-09 VITALS — BP 99/50 | HR 69 | Ht 62.0 in | Wt 139.6 lb

## 2023-02-09 DIAGNOSIS — N952 Postmenopausal atrophic vaginitis: Secondary | ICD-10-CM

## 2023-02-09 DIAGNOSIS — Z90710 Acquired absence of both cervix and uterus: Secondary | ICD-10-CM | POA: Diagnosis not present

## 2023-02-09 DIAGNOSIS — Z780 Asymptomatic menopausal state: Secondary | ICD-10-CM | POA: Diagnosis not present

## 2023-02-09 MED ORDER — ESTRADIOL 0.1 MG/GM VA CREA: TOPICAL_CREAM | VAGINAL | 3 refills | Status: AC

## 2023-02-09 NOTE — Progress Notes (Signed)
67 y.o. G2P2 Married female here for new patient exam.  She was on HRT for a short time.  H/o complete hysterectomy in 2007 due to polyps.  She's never had an abnormal pap smear.  She is   No LMP recorded. Patient is postmenopausal.          Sexually active: Yes.    The current method of family planning is status post hysterectomy.    Smoker:  no  Health Maintenance: Pap:  not indicated History of abnormal Pap:  no MMG:  07/2022 Colonoscopy:  01/19/2019.  Dr. Elnoria Howard.  7 year follow up BMD:   01/28/2022 Screening Labs: done with Dr. Drue Novel   reports that she has quit smoking. Her smoking use included cigarettes. She has never used smokeless tobacco. She reports current alcohol use.  Past Medical History:  Diagnosis Date   BCC (basal cell carcinoma), face    GAD (generalized anxiety disorder)    Hyperlipidemia    Osteopenia    Trigeminal neuralgia    Vitamin D deficiency     Past Surgical History:  Procedure Laterality Date   CATARACT EXTRACTION, BILATERAL Bilateral    CESAREAN SECTION  1990   REDUCTION MAMMAPLASTY Bilateral 1999   TOTAL ABDOMINAL HYSTERECTOMY  2007    Current Outpatient Medications  Medication Sig Dispense Refill   atorvastatin (LIPITOR) 40 MG tablet TAKE 1 TABLET BY MOUTH DAILY 90 tablet 1   Calcium 600-5 MG-MCG TABS Take by mouth.     citalopram (CELEXA) 20 MG tablet Take 1 tablet (20 mg total) by mouth daily. 90 tablet 1   clorazepate (TRANXENE) 7.5 MG tablet TAKE 1 TABLET BY MOUTH TWICE A DAY AS NEEDED 60 tablet 2   estradiol (ESTRACE) 0.1 MG/GM vaginal cream Place vaginally.     gabapentin (NEURONTIN) 100 MG capsule Take 1 capsule (100 mg total) by mouth 3 (three) times daily. 270 capsule 1   Vitamin D, Ergocalciferol, (DRISDOL) 1.25 MG (50000 UNIT) CAPS capsule Take 1 capsule (50,000 Units total) by mouth every 7 (seven) days. 12 capsule 0   No current facility-administered medications for this visit.    Family History  Problem Relation Age of Onset    Cerebral aneurysm Mother    Throat cancer Father    Breast cancer Neg Hx    Colon cancer Neg Hx     ROS: Constitutional: negative Genitourinary:negative  Exam:   BP (!) 99/50 (BP Location: Right Arm, Patient Position: Sitting, Cuff Size: Normal)   Pulse 69   Ht 5\' 2"  (1.575 m) Comment: Reported  Wt 139 lb 9.6 oz (63.3 kg)   BMI 25.53 kg/m   Height: 5\' 2"  (157.5 cm) (Reported)  General appearance: alert, cooperative and appears stated age Head: Normocephalic, without obvious abnormality, atraumatic Neck: no adenopathy, supple, symmetrical, trachea midline and thyroid normal to inspection and palpation Lungs: clear to auscultation bilaterally Breasts: normal appearance, no masses or tenderness Heart: regular rate and rhythm Abdomen: soft, non-tender; bowel sounds normal; no masses,  no organomegaly Extremities: extremities normal, atraumatic, no cyanosis or edema Skin: Skin color, texture, turgor normal. No rashes or lesions Lymph nodes: Cervical, supraclavicular, and axillary nodes normal. No abnormal inguinal nodes palpated Neurologic: Grossly normal   Pelvic: External genitalia:  no lesions              Urethra:  normal appearing urethra with no masses, tenderness or lesions              Bartholins and Skenes: normal  Vagina: normal appearing vagina with normal color and no discharge, no lesions              Cervix: absent              Pap taken: No. Bimanual Exam:  Uterus:  uterus absent              Adnexa: no mass, fullness, tenderness               Rectovaginal: Confirms               Anus:  normal sphincter tone, no lesions  Chaperone, Ina Homes, CMA, was present for exam.  Assessment/Plan: 1. Vaginal atrophy - estradiol (ESTRACE) 0.1 MG/GM vaginal cream; Place 1 gram vaginally twice weekly  Dispense: 42.5 g; Refill: 3  2. H/O total hysterectomy - guidelines for pap smears discussed.  Not indicated.  She is aware to call with any vaginal  bleeding.   3.  Postmenopausal - Pap smear not indicatd - Mammogram up to date - Colonoscopy follow up 7 years - Bone mineral density 2023 with osteopenia - lab work done with PCP, Dr. Drue Novel - vaccines reviewed/updated

## 2023-02-15 ENCOUNTER — Ambulatory Visit: Payer: Medicare Other | Admitting: Internal Medicine

## 2023-02-15 ENCOUNTER — Encounter: Payer: Self-pay | Admitting: Internal Medicine

## 2023-02-15 VITALS — BP 102/68 | HR 57 | Temp 97.8°F | Resp 16 | Ht 62.0 in | Wt 140.5 lb

## 2023-02-15 DIAGNOSIS — F411 Generalized anxiety disorder: Secondary | ICD-10-CM

## 2023-02-15 DIAGNOSIS — E559 Vitamin D deficiency, unspecified: Secondary | ICD-10-CM | POA: Diagnosis not present

## 2023-02-15 DIAGNOSIS — M8580 Other specified disorders of bone density and structure, unspecified site: Secondary | ICD-10-CM

## 2023-02-15 LAB — VITAMIN D 25 HYDROXY (VIT D DEFICIENCY, FRACTURES): VITD: 36.63 ng/mL (ref 30.00–100.00)

## 2023-02-15 NOTE — Progress Notes (Signed)
   Subjective:    Patient ID: Kelsey Humphrey, female    DOB: 1956-04-23, 67 y.o.   MRN: [id number]  DOS:  02/15/2023 Type of visit - description: Follow-up  Since last office visit is doing well.  She remains very active.  Chronic medical problems addressed  Review of Systems See above   Past Medical History:  Diagnosis Date   BCC (basal cell carcinoma), face    GAD (generalized anxiety disorder)    Hyperlipidemia    Osteopenia    Trigeminal neuralgia    Vitamin D deficiency     Past Surgical History:  Procedure Laterality Date   CATARACT EXTRACTION, BILATERAL Bilateral    CESAREAN SECTION  1990   REDUCTION MAMMAPLASTY Bilateral 1999   TOTAL ABDOMINAL HYSTERECTOMY  2007   polyps, done in [place]    Current Outpatient Medications  Medication Instructions   atorvastatin (LIPITOR) 40 mg, Oral, Daily   Calcium 600-5 MG-MCG TABS Oral   cholecalciferol (VITAMIN D3) 2,000 Units, Oral, Daily   citalopram (CELEXA) 20 mg, Oral, Daily   clorazepate (TRANXENE) 7.5 mg, Oral, 2 times daily PRN   estradiol (ESTRACE) 0.1 MG/GM vaginal cream Place 1 gram vaginally twice weekly   gabapentin (NEURONTIN) 100 mg, Oral, 3 times daily       Objective:   Physical Exam BP 102/68   Pulse (!) 57   Temp 97.8 F (36.6 C) (Oral)   Resp 16   Ht 5\' 2"  (1.575 m)   Wt 140 lb 8 oz (63.7 kg)   SpO2 99%   BMI 25.70 kg/m  General:   Well developed, NAD, BMI noted. HEENT:  Normocephalic . Face symmetric, atraumatic Lungs:  CTA B Normal respiratory effort, no intercostal retractions, no accessory muscle use. Heart: RRR,  no murmur.  Lower extremities: no pretibial edema bilaterally  Skin: Not pale. Not jaundice Neurologic:  alert & oriented X3.  Speech normal, gait appropriate for age and unassisted Psych--  Cognition and judgment appear intact.  Cooperative with normal attention span and concentration.  Behavior appropriate. No anxious or depressed appearing.      Assessment      ASSESSMENT High cholesterol Anxiety  HAs, trigeminal neuralgia (per OV note 03/15/2020)- gabapentin Osteopenia Low Vit D Chronic constipation (GI Dr Elnoria Howard, Rx Linzess) Menopausal BCC  sees derm  Osteopenia, T-score -1.6 (August 2023)  PLAN High cholesterol: Well-controlled on atorvastatin, last LFTs normal. Anxiety: Well-controlled on citalopram, takes Tranxene on average once daily. Low vitamin D, on OTCs, dose?.  Plan: Check a vitamin D, take 2000 units a day. Osteopenia: Remains very active, pickleball daily. Vaccine advice: Flu shot every fall, COVID booster if not done recently  RTC CPX 07-2023

## 2023-02-15 NOTE — Assessment & Plan Note (Signed)
High cholesterol: Well-controlled on atorvastatin, last LFTs normal. Anxiety: Well-controlled on citalopram, takes Tranxene on average once daily. Low vitamin D, on OTCs, dose?.  Plan: Check a vitamin D, take 2000 units a day. Osteopenia: Remains very active, pickleball daily. Vaccine advice: Flu shot every fall, COVID booster if not done recently  RTC CPX 07-2023

## 2023-02-15 NOTE — Patient Instructions (Addendum)
Vaccines I recommend:  Shingrix (shingles) #2 Covid booster Flu shot this fall  Take over-the-counter vitamin D: 2000 units daily. We are checking your levels today. We might need to give you another round of high-dose vitamin D weekly.      GO TO THE LAB : Get the blood work     GO TO THE FRONT DESK, PLEASE SCHEDULE YOUR APPOINTMENTS Come back for   a physical exam by 07-2023

## 2023-02-17 ENCOUNTER — Encounter: Payer: Self-pay | Admitting: Internal Medicine

## 2023-02-23 ENCOUNTER — Other Ambulatory Visit: Payer: Self-pay | Admitting: Internal Medicine

## 2023-05-04 ENCOUNTER — Other Ambulatory Visit: Payer: Self-pay

## 2023-05-04 MED ORDER — ATORVASTATIN CALCIUM 40 MG PO TABS: 40.0000 mg | ORAL_TABLET | Freq: Every day | ORAL | 1 refills | Status: DC

## 2023-05-15 ENCOUNTER — Telehealth: Payer: Self-pay | Admitting: Internal Medicine

## 2023-05-17 NOTE — Telephone Encounter (Signed)
PDMP okay, prescription sent 

## 2023-05-17 NOTE — Telephone Encounter (Signed)
Requesting: Clorazepate 7.5mg  Contract: 08/18/22 UDS: None Last Visit: 02/15/23 Next Visit: 08/20/23 Last Refill: 10/27/22 #60 and 2RF   Please Advise

## 2023-05-25 ENCOUNTER — Other Ambulatory Visit: Payer: Self-pay | Admitting: Internal Medicine

## 2023-07-08 ENCOUNTER — Telehealth: Payer: Self-pay

## 2023-07-08 NOTE — Telephone Encounter (Signed)
Contacted patient on preferred number listed in notes for scheduled AWV. Patient stated unable to complete visit today will call back to reschedule.

## 2023-07-08 NOTE — Progress Notes (Signed)
 This encounter was created in error - please disregard.

## 2023-08-04 DIAGNOSIS — K080 Exfoliation of teeth due to systemic causes: Secondary | ICD-10-CM | POA: Diagnosis not present

## 2023-08-16 DIAGNOSIS — H538 Other visual disturbances: Secondary | ICD-10-CM | POA: Diagnosis not present

## 2023-08-16 DIAGNOSIS — Z961 Presence of intraocular lens: Secondary | ICD-10-CM | POA: Diagnosis not present

## 2023-08-20 ENCOUNTER — Ambulatory Visit (INDEPENDENT_AMBULATORY_CARE_PROVIDER_SITE_OTHER): Payer: Medicare Other | Admitting: Internal Medicine

## 2023-08-20 ENCOUNTER — Encounter: Payer: Self-pay | Admitting: Internal Medicine

## 2023-08-20 VITALS — BP 108/60 | HR 58 | Temp 98.2°F | Resp 16 | Ht 62.0 in | Wt 136.1 lb

## 2023-08-20 DIAGNOSIS — Z79899 Other long term (current) drug therapy: Secondary | ICD-10-CM | POA: Diagnosis not present

## 2023-08-20 DIAGNOSIS — Z0001 Encounter for general adult medical examination with abnormal findings: Secondary | ICD-10-CM

## 2023-08-20 DIAGNOSIS — E559 Vitamin D deficiency, unspecified: Secondary | ICD-10-CM

## 2023-08-20 DIAGNOSIS — Z0000 Encounter for general adult medical examination without abnormal findings: Secondary | ICD-10-CM

## 2023-08-20 DIAGNOSIS — E782 Mixed hyperlipidemia: Secondary | ICD-10-CM | POA: Diagnosis not present

## 2023-08-20 DIAGNOSIS — F411 Generalized anxiety disorder: Secondary | ICD-10-CM | POA: Diagnosis not present

## 2023-08-20 DIAGNOSIS — Z1231 Encounter for screening mammogram for malignant neoplasm of breast: Secondary | ICD-10-CM

## 2023-08-20 DIAGNOSIS — G500 Trigeminal neuralgia: Secondary | ICD-10-CM

## 2023-08-20 LAB — CBC WITH DIFFERENTIAL/PLATELET
Basophils Absolute: 0 10*3/uL (ref 0.0–0.1)
Basophils Relative: 0.7 % (ref 0.0–3.0)
Eosinophils Absolute: 0.1 10*3/uL (ref 0.0–0.7)
Eosinophils Relative: 2.9 % (ref 0.0–5.0)
HCT: 37.4 % (ref 36.0–46.0)
Hemoglobin: 12.2 g/dL (ref 12.0–15.0)
Lymphocytes Relative: 27.4 % (ref 12.0–46.0)
Lymphs Abs: 1.1 10*3/uL (ref 0.7–4.0)
MCHC: 32.7 g/dL (ref 30.0–36.0)
MCV: 92.6 fL (ref 78.0–100.0)
Monocytes Absolute: 0.4 10*3/uL (ref 0.1–1.0)
Monocytes Relative: 11.3 % (ref 3.0–12.0)
Neutro Abs: 2.3 10*3/uL (ref 1.4–7.7)
Neutrophils Relative %: 57.7 % (ref 43.0–77.0)
Platelets: 250 10*3/uL (ref 150.0–400.0)
RBC: 4.04 Mil/uL (ref 3.87–5.11)
RDW: 12.9 % (ref 11.5–15.5)
WBC: 4 10*3/uL (ref 4.0–10.5)

## 2023-08-20 LAB — LIPID PANEL
Cholesterol: 171 mg/dL (ref 0–200)
HDL: 66.4 mg/dL (ref >39.00)
LDL Cholesterol: 90 mg/dL (ref 0–99)
NonHDL: 104.87
Total CHOL/HDL Ratio: 3
Triglycerides: 75 mg/dL (ref 0.0–149.0)
VLDL: 15 mg/dL (ref 0.0–40.0)

## 2023-08-20 LAB — COMPREHENSIVE METABOLIC PANEL
ALT: 28 U/L (ref 0–35)
AST: 38 U/L — ABNORMAL HIGH (ref 0–37)
Albumin: 4.2 g/dL (ref 3.5–5.2)
Alkaline Phosphatase: 56 U/L (ref 39–117)
BUN: 23 mg/dL (ref 6–23)
CO2: 31 meq/L (ref 19–32)
Calcium: 9.1 mg/dL (ref 8.4–10.5)
Chloride: 105 meq/L (ref 96–112)
Creatinine, Ser: 0.89 mg/dL (ref 0.40–1.20)
GFR: 67.72 mL/min (ref >60.00)
Glucose, Bld: 95 mg/dL (ref 70–99)
Potassium: 4.8 meq/L (ref 3.5–5.1)
Sodium: 141 meq/L (ref 135–145)
Total Bilirubin: 0.7 mg/dL (ref 0.2–1.2)
Total Protein: 6.2 g/dL (ref 6.0–8.3)

## 2023-08-20 LAB — VITAMIN D 25 HYDROXY (VIT D DEFICIENCY, FRACTURES): VITD: 27.82 ng/mL — ABNORMAL LOW (ref 30.00–100.00)

## 2023-08-20 MED ORDER — CLORAZEPATE DIPOTASSIUM 7.5 MG PO TABS: 7.5000 mg | ORAL_TABLET | Freq: Two times a day (BID) | ORAL | 4 refills | Status: DC | PRN

## 2023-08-20 MED ORDER — GABAPENTIN 100 MG PO CAPS: 100.0000 mg | ORAL_CAPSULE | Freq: Two times a day (BID) | ORAL | 1 refills | Status: DC

## 2023-08-20 NOTE — Patient Instructions (Addendum)
 Vaccines I recommend: Covid booster 1 additional shingles vaccine     GO TO THE LAB : Get the blood work     Next visit with me 1 year.  Sooner if needed Please schedule it at the front desk      "Health Care Power of attorney" ,  "Living will" (Advance care planning documents)  If you already have a living will or healthcare power of attorney, is recommended you bring the copy to be scanned in your chart.   The document will be available to all the doctors you see in the system.  Advance care planning is a process that supports adults in  understanding and sharing their preferences regarding future medical care.  The patient's preferences are recorded in documents called Advance Directives and the can be modified at any time while the patient is in full mental capacity.   If you don't have one, please consider create one.      More information at: [email or web address]

## 2023-08-20 NOTE — Progress Notes (Signed)
 Subjective:    Patient ID: Kelsey Humphrey, female    DOB: Oct 14, 1955, 68 y.o.   MRN: [id number]  DOS:  08/20/2023 Type of visit - description: CPX  Here for CPX. Feels well.   Review of Systems  A 14 point review of systems is negative    Past Medical History:  Diagnosis Date   BCC (basal cell carcinoma), face    GAD (generalized anxiety disorder)    Hyperlipidemia    Osteopenia    Trigeminal neuralgia    Vitamin D deficiency     Past Surgical History:  Procedure Laterality Date   CATARACT EXTRACTION, BILATERAL Bilateral    CESAREAN SECTION  1990   REDUCTION MAMMAPLASTY Bilateral 1999   TOTAL ABDOMINAL HYSTERECTOMY  2007   polyps, done in [place]    Social History   Socioeconomic History   Marital status: Married    Spouse name: Not on file   Number of children: 2   Years of education: Not on file   Highest education level: Some college, no degree  Occupational History   Occupation: retired - Audiological scientist  Tobacco Use   Smoking status: Former    Types: Cigarettes   Smokeless tobacco: Never   Tobacco comments:    Remote smoker , << 1 ppd   Substance and Sexual Activity   Alcohol use: Yes   Drug use: Not on file   Sexual activity: Not on file  Other Topics Concern   Not on file  Social History Narrative   Original from [place], moves to the [place] 1980s   2 adult children   Plays pickle ball every day   Social Drivers of Health   Financial Resource Strain: Low Risk  (08/19/2023)   Overall Financial Resource Strain (CARDIA)    Difficulty of Paying Living Expenses: Not hard at all  Food Insecurity: No Food Insecurity (08/19/2023)   Hunger Vital Sign    Worried About Running Out of Food in the Last Year: Never true    Ran Out of Food in the Last Year: Never true  Transportation Needs: No Transportation Needs (08/19/2023)   PRAPARE - Administrator, Civil Service (Medical): No    Lack of Transportation (Non-Medical): No  Physical Activity:  Sufficiently Active (08/19/2023)   Exercise Vital Sign    Days of Exercise per Week: 5 days    Minutes of Exercise per Session: 120 min  Stress: No Stress Concern Present (08/19/2023)   [employer] of Occupational Health - Occupational Stress Questionnaire    Feeling of Stress : Not at all  Social Connections: Socially Integrated (08/19/2023)   Social Connection and Isolation Panel [NHANES]    Frequency of Communication with Friends and Family: More than three times a week    Frequency of Social Gatherings with Friends and Family: More than three times a week    Attends Religious Services: 1 to 4 times per year    Active Member of [employer] or Organizations: Yes    Attends Banker Meetings: More than 4 times per year    Marital Status: Married  Catering manager Violence: Unknown (10/02/2021)   Received from [employer], [facility]   HITS    Physically Hurt: Not on file    Insult or Talk Down To: Not on file    Threaten Physical Harm: Not on file    Scream or Curse: Not on file     Current Outpatient Medications  Medication Instructions   atorvastatin (LIPITOR)  40 mg, Oral, Daily   Calcium 600-5 MG-MCG TABS Take by mouth.   cholecalciferol (VITAMIN D3) 2,000 Units, Daily   citalopram (CELEXA) 20 mg, Oral, Daily   clorazepate (TRANXENE) 7.5 mg, Oral, 2 times daily PRN   estradiol (ESTRACE) 0.1 MG/GM vaginal cream Place 1 gram vaginally twice weekly   gabapentin (NEURONTIN) 100 mg, Oral, 2 times daily       Objective:   Physical Exam BP 108/60   Pulse (!) 58   Temp 98.2 F (36.8 C) (Oral)   Resp 16   Ht 5\' 2"  (1.575 m)   Wt 136 lb 2 oz (61.7 kg)   SpO2 98%   BMI 24.90 kg/m  General: Well developed, NAD, BMI noted Neck: No  thyromegaly  HEENT:  Normocephalic . Face symmetric, atraumatic Lungs:  CTA B Normal respiratory effort, no intercostal retractions, no accessory muscle use. Heart: RRR,  no murmur.  Abdomen:  Not distended, soft, non-tender.  No rebound or rigidity.   Lower extremities: no pretibial edema bilaterally  Skin: Exposed areas without rash. Not pale. Not jaundice Neurologic:  alert & oriented X3.  Speech normal, gait appropriate for age and unassisted Strength symmetric and appropriate for age.  Psych: Cognition and judgment appear intact.  Cooperative with normal attention span and concentration.  Behavior appropriate. No anxious or depressed appearing.     Assessment    ASSESSMENT High cholesterol Anxiety  HAs, trigeminal neuralgia (per OV note 03/15/2020)- gabapentin Osteopenia Low Vit D Chronic constipation (GI Dr Elnoria Howard, Rx Linzess) Menopausal BCC  sees derm  Osteopenia, T-score -1.6 (August 2023)  PLAN Here for CPX Td: 2019; PNM 20: 2024; Shingrix 2021 Vaccines I recommend: Shingrix No. 2, COVID booster and flu shot MMG 07/2022 ([facility]).  Plans to get another mammogram soon Female care: Saw gynecology 01-2023  bones: T-score -1.6, August 2023, next 2 to 3 years CCS: C-scope 01/19/2019, follow-up 7 years per pathology report. Labs: CMP FLP CBC vitamin D Healthcare POA: See AVS Other issues addressed  High cholesterol: On atorvastatin, check FLP AST ALT Anxiety: Well-controlled on citalopram, Also on Tranxene BID, ok to change to BID PRN only.PDMP okay, Rx sent. Migraines: Well-controlled on gabapentin, self decreased to twice daily with good results. RF sent  Vitamin D deficiency: Checking labs. On 2,000 units every day  RTC 1 year

## 2023-08-21 ENCOUNTER — Encounter: Payer: Self-pay | Admitting: Internal Medicine

## 2023-08-22 ENCOUNTER — Encounter: Payer: Self-pay | Admitting: Internal Medicine

## 2023-08-22 LAB — DRUG MONITORING PANEL 375977 , URINE
Alcohol Metabolites: POSITIVE ng/mL — AB (ref <500)
Alphahydroxyalprazolam: NEGATIVE ng/mL (ref <25)
Alphahydroxymidazolam: NEGATIVE ng/mL (ref <50)
Alphahydroxytriazolam: NEGATIVE ng/mL (ref <50)
Aminoclonazepam: NEGATIVE ng/mL (ref <25)
Amphetamines: NEGATIVE ng/mL (ref <500)
Barbiturates: NEGATIVE ng/mL (ref <300)
Benzodiazepines: POSITIVE ng/mL — AB (ref <100)
Cocaine Metabolite: NEGATIVE ng/mL (ref <150)
Desmethyltramadol: NEGATIVE ng/mL (ref <100)
Ethyl Glucuronide (ETG): 15915 ng/mL — ABNORMAL HIGH (ref <500)
Ethyl Sulfate (ETS): 4109 ng/mL — ABNORMAL HIGH (ref <100)
Hydroxyethylflurazepam: NEGATIVE ng/mL (ref <50)
Lorazepam: NEGATIVE ng/mL (ref <50)
Marijuana Metabolite: NEGATIVE ng/mL (ref <20)
Nordiazepam: NEGATIVE ng/mL (ref <50)
Opiates: NEGATIVE ng/mL (ref <100)
Oxazepam: 626 ng/mL — ABNORMAL HIGH (ref <50)
Oxycodone: NEGATIVE ng/mL (ref <100)
Temazepam: NEGATIVE ng/mL (ref <50)
Tramadol: NEGATIVE ng/mL (ref <100)

## 2023-08-22 LAB — DM TEMPLATE

## 2023-08-24 MED ORDER — VITAMIN D (ERGOCALCIFEROL) 1.25 MG (50000 UNIT) PO CAPS: 50000.0000 [IU] | ORAL_CAPSULE | ORAL | 5 refills | Status: DC

## 2023-08-24 NOTE — Addendum Note (Signed)
 Addended by: Maximino Sarin on: 08/24/2023 01:56 PM   Modules accepted: Orders

## 2023-08-30 DIAGNOSIS — H3530 Unspecified macular degeneration: Secondary | ICD-10-CM | POA: Diagnosis not present

## 2023-08-30 DIAGNOSIS — H43311 Vitreous membranes and strands, right eye: Secondary | ICD-10-CM | POA: Diagnosis not present

## 2023-08-30 DIAGNOSIS — H5213 Myopia, bilateral: Secondary | ICD-10-CM | POA: Diagnosis not present

## 2023-08-30 DIAGNOSIS — Z961 Presence of intraocular lens: Secondary | ICD-10-CM | POA: Diagnosis not present

## 2023-09-01 ENCOUNTER — Ambulatory Visit (HOSPITAL_BASED_OUTPATIENT_CLINIC_OR_DEPARTMENT_OTHER)
Admission: RE | Admit: 2023-09-01 | Discharge: 2023-09-01 | Disposition: A | Discharge status: Home / Self Care | Payer: Medicare Other | Source: Ambulatory Visit | Attending: Internal Medicine | Admitting: Internal Medicine

## 2023-09-01 ENCOUNTER — Encounter (HOSPITAL_BASED_OUTPATIENT_CLINIC_OR_DEPARTMENT_OTHER): Payer: Self-pay | Admitting: Radiology

## 2023-09-01 DIAGNOSIS — Z1231 Encounter for screening mammogram for malignant neoplasm of breast: Secondary | ICD-10-CM | POA: Insufficient documentation

## 2023-10-21 DIAGNOSIS — K080 Exfoliation of teeth due to systemic causes: Secondary | ICD-10-CM | POA: Diagnosis not present

## 2023-10-21 DIAGNOSIS — M272 Inflammatory conditions of jaws: Secondary | ICD-10-CM | POA: Diagnosis not present

## 2023-11-01 ENCOUNTER — Telehealth: Payer: Self-pay | Admitting: Internal Medicine

## 2023-11-01 NOTE — Telephone Encounter (Signed)
 Copied from CRM [id number]. Topic: Medicare AWV >> Nov 01, 2023  2:41 PM Juliana Ocean wrote: Reason for CRM: LVM 11/01/2023 to schedule AWV. Please schedule Virtual or Telehealth visits ONLY.   Kelsey Humphrey; Care Guide Ambulatory Clinical Support [facility] Direct Dial: [phone]

## 2023-12-17 ENCOUNTER — Other Ambulatory Visit: Payer: Self-pay | Admitting: Internal Medicine

## 2024-02-15 DIAGNOSIS — M79674 Pain in right toe(s): Secondary | ICD-10-CM | POA: Diagnosis not present

## 2024-02-15 DIAGNOSIS — M79675 Pain in left toe(s): Secondary | ICD-10-CM | POA: Diagnosis not present

## 2024-02-28 ENCOUNTER — Telehealth: Payer: Self-pay | Admitting: Internal Medicine

## 2024-02-29 NOTE — Telephone Encounter (Signed)
 PDMP okay, Rx sent

## 2024-02-29 NOTE — Telephone Encounter (Signed)
 Requesting: clorazepate  7.5mg   Contract: 08/18/22 UDS: 08/20/23 Last Visit:  08/20/23 Next Visit: None Last Refill: 08/20/23 #60 and 4RF  Please Advise

## 2024-03-01 ENCOUNTER — Other Ambulatory Visit: Payer: Self-pay | Admitting: Internal Medicine

## 2024-03-01 NOTE — Telephone Encounter (Signed)
 Chart not reviewed, clorazepate  was refilled yesterday, 02/29/24. Error CRM sent.

## 2024-03-01 NOTE — Telephone Encounter (Signed)
 Copied from CRM #[id number]. Topic: Clinical - Medication Refill >> Mar 01, 2024  9:52 AM Kelsey Humphrey wrote: Medication: clorazepate  (TRANXENE ) 7.5 MG tablet  Please note that patient is going out of town for 3 weeks tomorrow and would like this filled before she leaves if possible please.  Has the patient contacted their pharmacy? Yes (Agent: If no, request that the patient contact the pharmacy for the refill. If patient does not wish to contact the pharmacy document the reason why and proceed with request.) (Agent: If yes, when and what did the pharmacy advise?)  This is the patient's preferred pharmacy:  [facility] [id number] [place], [place] - [name] [address] Phone: [phone] Fax: [phone]  Is this the correct pharmacy for this prescription? Yes If no, delete pharmacy and type the correct one.   Has the prescription been filled recently? No  Is the patient out of the medication? No  Has the patient been seen for an appointment in the last year OR does the patient have an upcoming appointment? Yes  Can we respond through MyChart? Yes  Agent: Please be advised that Rx refills may take up to 3 business days. We ask that you follow-up with your pharmacy.

## 2024-03-11 ENCOUNTER — Other Ambulatory Visit: Payer: Self-pay | Admitting: Internal Medicine

## 2024-04-11 ENCOUNTER — Other Ambulatory Visit: Payer: Self-pay | Admitting: Internal Medicine

## 2024-04-11 MED ORDER — GABAPENTIN 100 MG PO CAPS: 100.0000 mg | ORAL_CAPSULE | Freq: Two times a day (BID) | ORAL | 1 refills | Status: AC

## 2024-04-11 NOTE — Telephone Encounter (Signed)
 Copied from CRM [id number]. Topic: Clinical - Medication Refill >> Apr 11, 2024  8:51 AM Berneda FALCON wrote: Medication:  gabapentin  (NEURONTIN ) 100 MG capsule   Has the patient contacted their pharmacy? Yes (Agent: If no, request that the patient contact the pharmacy for the refill. If patient does not wish to contact the pharmacy document the reason why and proceed with request.) (Agent: If yes, when and what did the pharmacy advise?)  This is the patient's preferred pharmacy:  [facility] [id number], [place] - [name] [address] Phone: [phone] Fax: [phone]  Is this the correct pharmacy for this prescription? Yes If no, delete pharmacy and type the correct one.   Has the prescription been filled recently? No  Is the patient out of the medication? Yes  Has the patient been seen for an appointment in the last year OR does the patient have an upcoming appointment? Yes  Can we respond through MyChart? Yes  Agent: Please be advised that Rx refills may take up to 3 business days. We ask that you follow-up with your pharmacy.

## 2024-04-25 ENCOUNTER — Ambulatory Visit: Admitting: Internal Medicine

## 2024-04-25 ENCOUNTER — Encounter: Payer: Self-pay | Admitting: Internal Medicine

## 2024-04-25 VITALS — BP 108/72 | HR 59 | Temp 98.2°F | Resp 16 | Ht 62.0 in | Wt 138.5 lb

## 2024-04-25 DIAGNOSIS — Z780 Asymptomatic menopausal state: Secondary | ICD-10-CM

## 2024-04-25 DIAGNOSIS — Z23 Encounter for immunization: Secondary | ICD-10-CM | POA: Diagnosis not present

## 2024-04-25 DIAGNOSIS — G500 Trigeminal neuralgia: Secondary | ICD-10-CM

## 2024-04-25 DIAGNOSIS — E559 Vitamin D deficiency, unspecified: Secondary | ICD-10-CM | POA: Diagnosis not present

## 2024-04-25 LAB — VITAMIN D 25 HYDROXY (VIT D DEFICIENCY, FRACTURES): VITD: 41.76 ng/mL (ref 30.00–100.00)

## 2024-04-25 NOTE — Patient Instructions (Addendum)
 GO TO THE LAB :  Get the blood work    Then, go to the front desk for the checkout Please make an appointment for a physical exam by 07-2024 Go to the first floor and schedule your density test    YOUR PLAN:  TRIGEMINAL NEURALGIA: This condition is being managed with gabapentin , and you have not had any recent flare-ups. -Continue taking gabapentin  100 mg twice daily.   ANXIETY : Your anxiety is being managed with citalopram  and clorazepate , and there are no new concerns. -Continue taking citalopram  20 mg daily. -Continue taking clorazepate  7.5 mg twice daily as needed.  HYPERLIPIDEMIA: Your cholesterol levels are well-controlled with atorvastatin . -Continue taking atorvastatin  40 mg daily.  OSTEOPENIA: You are due for a bone density scan and benefit from regular vitamin D  intake and physical activity. -A bone density scan has been ordered.  VITAMIN D  DEFICIENCY: Your vitamin D  levels need to be re-evaluated. -A vitamin D  level test has been ordered.  VACCINATIONS:   -Complete the shingles vaccine series. -Consider getting another COVID-19 vaccine dose after further research.

## 2024-04-25 NOTE — Progress Notes (Unsigned)
 Subjective:    Patient ID: Kelsey Humphrey, female    DOB: August 23, 1955, 68 y.o.   MRN: [id number]  DOS:  04/25/2024 Follow-up  Discussed the use of AI scribe software for clinical note transcription with the patient, who gave verbal consent to proceed.  History of Present Illness Kelsey Humphrey is a 68 year old female who presents for a routine check-up.  General health status - Feels well with no changes in health status - Remains active, playing pickleball daily - No new symptoms or health concerns  Medication management - Inquires about the necessity of continuing gabapentin  indefinitely  Immunization status - Received one dose of the shingles vaccine but has not completed the series - Uncertain about the need for another COVID-19 vaccine dose      Review of Systems See above   Past Medical History:  Diagnosis Date   BCC (basal cell carcinoma), face    GAD (generalized anxiety disorder)    Hyperlipidemia    Osteopenia    Trigeminal neuralgia    Vitamin D  deficiency     Past Surgical History:  Procedure Laterality Date   CATARACT EXTRACTION, BILATERAL Bilateral    CESAREAN SECTION  1990   REDUCTION MAMMAPLASTY Bilateral 1999   TOTAL ABDOMINAL HYSTERECTOMY  2007   polyps, done in [place]    Current Outpatient Medications  Medication Instructions   atorvastatin  (LIPITOR) 40 mg, Oral, Daily   Calcium  600-5 MG-MCG TABS Take by mouth.   cholecalciferol (VITAMIN D3) 2,000 Units, Daily   citalopram  (CELEXA ) 20 mg, Oral, Daily   clorazepate  (TRANXENE ) 7.5 mg, Oral, 2 times daily PRN   estradiol  (ESTRACE ) 0.1 MG/GM vaginal cream Place 1 gram vaginally twice weekly   gabapentin  (NEURONTIN ) 100 mg, Oral, 2 times daily   Vitamin D  (Ergocalciferol ) (DRISDOL ) 50,000 Units, Oral, Every 7 days       Objective:   Physical Exam BP 108/72   Pulse (!) 59   Temp 98.2 F (36.8 C) (Oral)   Resp 16   Ht 5' 2 (1.575 m)   Wt 138 lb 8 oz (62.8 kg)   SpO2 97%   BMI 25.33  kg/m  General: Well developed, NAD, BMI noted Neck: No  thyromegaly  HEENT:  Normocephalic . Face symmetric, atraumatic Lungs:  CTA B Normal respiratory effort, no intercostal retractions, no accessory muscle use. Heart: RRR,  no murmur.  Abdomen:  Not distended, soft, non-tender. No rebound or rigidity.   Lower extremities: no pretibial edema bilaterally  Skin: Exposed areas without rash. Not pale. Not jaundice Neurologic:  alert & oriented X3.  Speech normal, gait appropriate for age and unassisted Strength symmetric and appropriate for age.  Psych: Cognition and judgment appear intact.  Cooperative with normal attention span and concentration.  Behavior appropriate. No anxious or depressed appearing.     Assessment     ASSESSMENT High cholesterol Anxiety  HAs, trigeminal neuralgia (per OV note 03/15/2020)- gabapentin  Osteopenia Low Vit D Chronic constipation (GI Dr Rollin, was Rx Linzess) Menopausal BCC  sees derm  Osteopenia, T-score -1.6 (August 2023)  Assessment & Plan Trigeminal neuralgia Managed with gabapentin , no recent exacerbations, low dose well-tolerated.  No change but considerr tapering off gabapentin  if symptoms remain controlled. Anxiety: Managed with citalopram  and clorazepate , no new concerns.  No change Hyperlipidemia Cholesterol levels well-controlled with atorvastatin  40 mg daily Osteopenia Due for bone density scan, benefits from regular vitamin D  intake and physical activity discussed.  Rx DEXA. Vitamin D  deficiency Was Rx ergocalciferol , check  vitamin D  levels. Vaccine advise: Had a flu shot,incomplete shingles vaccine series, hesitant about COVID vaccine. - Advise to complete shingles vaccine series. - Encourage consideration of COVID vaccination after further research. RTC CPX 07/2024

## 2024-04-26 ENCOUNTER — Ambulatory Visit: Payer: Self-pay | Admitting: Internal Medicine

## 2024-04-26 NOTE — Assessment & Plan Note (Signed)
 Trigeminal neuralgia Managed with gabapentin , no recent exacerbations, low dose well-tolerated.  No change but considerr tapering off gabapentin  if symptoms remain controlled. Anxiety: Managed with citalopram  and clorazepate , no new concerns.  No change Hyperlipidemia Cholesterol levels well-controlled with atorvastatin  40 mg daily Osteopenia Due for bone density scan, benefits from regular vitamin D  intake and physical activity discussed.  Rx DEXA. Vitamin D  deficiency Was Rx ergocalciferol , check vitamin D  levels. Vaccine advise: Had a flu shot,incomplete shingles vaccine series, hesitant about COVID vaccine. - Advise to complete shingles vaccine series. - Encourage consideration of COVID vaccination after further research. RTC CPX 07/2024

## 2024-05-30 ENCOUNTER — Other Ambulatory Visit (HOSPITAL_BASED_OUTPATIENT_CLINIC_OR_DEPARTMENT_OTHER)

## 2024-06-12 ENCOUNTER — Other Ambulatory Visit: Payer: Self-pay | Admitting: Internal Medicine

## 2024-06-12 NOTE — Telephone Encounter (Signed)
 Requesting: clorazepate  7.5mg   Contract: 08/18/22 UDS: 08/20/23 Last Visit: 04/25/24 Next Visit: None Last Refill: 02/29/24 #60 and 1RF   Please Advise

## 2024-06-15 ENCOUNTER — Ambulatory Visit

## 2024-07-11 ENCOUNTER — Other Ambulatory Visit: Payer: Self-pay

## 2024-07-11 DIAGNOSIS — H9193 Unspecified hearing loss, bilateral: Secondary | ICD-10-CM

## 2024-07-13 ENCOUNTER — Telehealth: Payer: Self-pay | Admitting: *Deleted

## 2024-07-13 ENCOUNTER — Other Ambulatory Visit (HOSPITAL_BASED_OUTPATIENT_CLINIC_OR_DEPARTMENT_OTHER)

## 2024-07-13 ENCOUNTER — Ambulatory Visit: Admitting: *Deleted

## 2024-07-13 VITALS — Ht 62.0 in | Wt 132.0 lb

## 2024-07-13 DIAGNOSIS — Z1231 Encounter for screening mammogram for malignant neoplasm of breast: Secondary | ICD-10-CM

## 2024-07-13 DIAGNOSIS — Z0000 Encounter for general adult medical examination without abnormal findings: Secondary | ICD-10-CM

## 2024-07-13 DIAGNOSIS — E782 Mixed hyperlipidemia: Secondary | ICD-10-CM

## 2024-07-13 DIAGNOSIS — Z79899 Other long term (current) drug therapy: Secondary | ICD-10-CM

## 2024-07-13 DIAGNOSIS — E559 Vitamin D deficiency, unspecified: Secondary | ICD-10-CM

## 2024-07-13 DIAGNOSIS — F411 Generalized anxiety disorder: Secondary | ICD-10-CM

## 2024-07-13 NOTE — Progress Notes (Signed)
 " Please attest this visit in the absence of patient primary care provider.   Chief Complaint  Patient presents with   Medicare Wellness     Subjective:   Kelsey Humphrey is a 69 y.o. female who presents for a Medicare Annual Wellness Visit.  Visit info / Clinical Intake: Medicare Wellness Visit Type:: Initial Annual Wellness Visit Persons participating in visit and providing information:: patient Medicare Wellness Visit Mode:: Telephone If telephone:: video declined Since this visit was completed virtually, some vitals may be partially provided or unavailable. Missing vitals are due to the limitations of the virtual format.: Unable to obtain vitals - no equipment If Telephone or Video please confirm:: I connected with patient using audio/video enable telemedicine. I verified patient identity with two identifiers, discussed telehealth limitations, and patient agreed to proceed. Patient Location:: home Provider Location:: office Interpreter Needed?: No Pre-visit prep was completed: yes AWV questionnaire completed by patient prior to visit?: yes Living arrangements:: (Patient-Rptd) lives with spouse/significant other Patient's Overall Health Status Rating: (Patient-Rptd) excellent Typical amount of pain: (Patient-Rptd) none Does pain affect daily life?: (Patient-Rptd) no Are you currently prescribed opioids?: no  Dietary Habits and Nutritional Risks How many meals a day?: (Patient-Rptd) 3 Eats fruit and vegetables daily?: (Patient-Rptd) yes Most meals are obtained by: (Patient-Rptd) preparing own meals In the last 2 weeks, have you had any of the following?: none Diabetic:: no  Functional Status Activities of Daily Living (to include ambulation/medication): (Patient-Rptd) Independent Ambulation: (Patient-Rptd) Independent Medication Administration: (Patient-Rptd) Independent Home Management (perform basic housework or laundry): (Patient-Rptd) Independent Manage your own finances?:  (Patient-Rptd) yes Primary transportation is: (Patient-Rptd) driving Concerns about vision?: no *vision screening is required for WTM* (up to date Rhonda thurmond) Concerns about hearing?: no  Fall Screening Falls in the past year?: (Patient-Rptd) 0 Number of falls in past year: 0 Was there an injury with Fall?: 0 Fall Risk Category Calculator: 0 Patient Fall Risk Level: Low Fall Risk  Fall Risk Patient at Risk for Falls Due to: No Fall Risks Fall risk Follow up: Falls evaluation completed  Home and Transportation Safety: All rugs have non-skid backing?: (Patient-Rptd) yes All stairs or steps have railings?: (Patient-Rptd) yes Grab bars in the bathtub or shower?: (!) (Patient-Rptd) no Have non-skid surface in bathtub or shower?: (!) (Patient-Rptd) no Good home lighting?: (Patient-Rptd) yes Regular seat belt use?: (Patient-Rptd) yes Hospital stays in the last year:: (Patient-Rptd) no  Cognitive Assessment Difficulty concentrating, remembering, or making decisions? : (Patient-Rptd) no Will 6CIT or Mini Cog be Completed: yes What year is it?: 0 points What month is it?: 0 points Give patient an address phrase to remember (5 components): [address] [place]  About what time is it?: 0 points Count backwards from 20 to 1: 0 points Say the months of the year in reverse: 0 points Repeat the address phrase from earlier: 2 points 6 CIT Score: 2 points  Advance Directives (For Healthcare) Does Patient Have a Medical Advance Directive?: Yes Does patient want to make changes to medical advance directive?: No - Patient declined Type of Advance Directive: Healthcare Power of [place]; Living will Copy of Healthcare Power of Attorney in Chart?: Yes - validated most recent copy scanned in chart (See row information) Copy of Living Will in Chart?: Yes - validated most recent copy scanned in chart (See row information)  Reviewed/Updated  Reviewed/Updated: Reviewed All  (Medical, Surgical, Family, Medications, Allergies, Care Teams, Patient Goals)    Allergies (verified) Patient has no known allergies.  Current Medications (verified) Outpatient Encounter Medications as of 07/13/2024  Medication Sig   atorvastatin  (LIPITOR) 40 MG tablet Take 1 tablet (40 mg total) by mouth daily.   Calcium  600-5 MG-MCG TABS Take by mouth.   cholecalciferol (VITAMIN D3) 25 MCG (1000 UNIT) tablet Take 2,000 Units by mouth daily.   citalopram  (CELEXA ) 20 MG tablet Take 1 tablet (20 mg total) by mouth daily.   clorazepate  (TRANXENE ) 7.5 MG tablet TAKE 1 TABLET BY MOUTH 2 TIMES A DAY AS NEEDED   estradiol  (ESTRACE ) 0.1 MG/GM vaginal cream Place 1 gram vaginally twice weekly   gabapentin  (NEURONTIN ) 100 MG capsule Take 1 capsule (100 mg total) by mouth 2 (two) times daily.   [DISCONTINUED] Vitamin D , Ergocalciferol , (DRISDOL ) 1.25 MG (50000 UNIT) CAPS capsule TAKE 1 CAPSULE BY MOUTH EVERY 7 DAYS   No facility-administered encounter medications on file as of 07/13/2024.    History: Past Medical History:  Diagnosis Date   BCC (basal cell carcinoma), face    GAD (generalized anxiety disorder)    Hyperlipidemia    Osteopenia    Trigeminal neuralgia    Vitamin D  deficiency    Past Surgical History:  Procedure Laterality Date   CATARACT EXTRACTION, BILATERAL Bilateral    CESAREAN SECTION  1990   REDUCTION MAMMAPLASTY Bilateral 1999   TOTAL ABDOMINAL HYSTERECTOMY  2007   polyps, done in [place]   Family History  Problem Relation Age of Onset   Cerebral aneurysm Mother    Throat cancer Father    CAD Brother 33   Breast cancer Neg Hx    Colon cancer Neg Hx    Social History   Occupational History   Occupation: retired - audiological scientist  Tobacco Use   Smoking status: Former    Types: Cigarettes   Smokeless tobacco: Never   Tobacco comments:    Remote smoker , << 1 ppd   Substance and Sexual Activity   Alcohol use: Yes    Comment: 2 drinks per weekend   Drug use:  Not on file   Sexual activity: Not on file   Tobacco Counseling Counseling given: Not Answered Tobacco comments: Remote smoker , << 1 ppd   SDOH Screenings   Food Insecurity: No Food Insecurity (07/13/2024)  Housing: Low Risk (07/13/2024)  Transportation Needs: No Transportation Needs (07/13/2024)  Utilities: Not At Risk (07/13/2024)  Alcohol Screen: Low Risk (04/19/2024)  Depression (PHQ2-9): Low Risk (07/13/2024)  Financial Resource Strain: Low Risk (04/19/2024)  Physical Activity: Sufficiently Active (07/13/2024)  Social Connections: Socially Integrated (07/13/2024)  Stress: No Stress Concern Present (07/13/2024)  Tobacco Use: Medium Risk (07/13/2024)   See flowsheets for full screening details  Depression Screen PHQ 2 & 9 Depression Scale- Over the past 2 weeks, how often have you been bothered by any of the following problems? Little interest or pleasure in doing things: 0 Feeling down, depressed, or hopeless (PHQ Adolescent also includes...irritable): 0 PHQ-2 Total Score: 0 Trouble falling or staying asleep, or sleeping too much: 0 Feeling tired or having little energy: 0 Poor appetite or overeating (PHQ Adolescent also includes...weight loss): 0 Feeling bad about yourself - or that you are a failure or have let yourself or your family down: 0 Trouble concentrating on things, such as reading the newspaper or watching television (PHQ Adolescent also includes...like school work): 0 Moving or speaking so slowly that other people could have noticed. Or the opposite - being so fidgety or restless that you have been moving around a lot more than usual:  0 Thoughts that you would be better off dead, or of hurting yourself in some way: 0 PHQ-9 Total Score: 0 If you checked off any problems, how difficult have these problems made it for you to do your work, take care of things at home, or get along with other people?: Not difficult at all     Goals Addressed             This Visit's  Progress    To go to a gym 3 x weekly               Objective:    Today's Vitals   07/13/24 1501  Weight: 132 lb (59.9 kg)  Height: 5' 2 (1.575 m)   Body mass index is 24.14 kg/m.  Hearing/Vision screen No results found. Immunizations and Health Maintenance Health Maintenance  Topic Date Due   Zoster Vaccines- Shingrix (2 of 2) 06/27/2021   COVID-19 Vaccine (4 - 2025-26 season) 07/29/2024 (Originally 02/28/2024)   Medicare Annual Wellness (AWV)  07/13/2025   Mammogram  08/31/2025   Colonoscopy  01/18/2026   DTaP/Tdap/Td (2 - Td or Tdap) 02/18/2028   Pneumococcal Vaccine: 50+ Years  Completed   Influenza Vaccine  Completed   Bone Density Scan  Completed   Hepatitis C Screening  Completed   Meningococcal B Vaccine  Aged Out        Assessment/Plan:  This is a routine wellness examination for Kelsey Humphrey.  Patient Care Team: Amon Aloysius BRAVO, MD as PCP - General (Internal Medicine) Rollin Dover, MD as Consulting Physician (Gastroenterology) Heide Ingle, MD as Consulting Physician (Orthopedic Surgery) Cleotilde Ronal RAMAN, MD as Consulting Physician (Obstetrics and Gynecology) Joane Artist RAMAN, MD as Consulting Physician (Family Medicine) Elma Shona RAMAN, MD as Attending Physician (Optometry) Dermatology, [facility]  I have personally reviewed and noted the following in the patients chart:   Medical and social history Use of alcohol, tobacco or illicit drugs  Current medications and supplements including opioid prescriptions. Functional ability and status Nutritional status Physical activity Advanced directives List of other physicians Hospitalizations, surgeries, and ER visits in previous 12 months Vitals Screenings to include cognitive, depression, and falls Referrals and appointments  No orders of the defined types were placed in this encounter.  In addition, I have reviewed and discussed with patient certain preventive protocols, quality metrics, and best  practice recommendations. A written personalized care plan for preventive services as well as general preventive health recommendations were provided to patient.   Lolita Libra, CMA   07/13/2024   Return in 1 year (on 07/13/2025).  After Visit Summary: (MyChart) Due to this being a telephonic visit, the after visit summary with patients personalized plan was offered to patient via MyChart   Nurse Notes: HM Addressed: Vaccines Due: 2nd Shingrix Mammogram ordered  "

## 2024-07-13 NOTE — Patient Instructions (Addendum)
 Ms. Bosler,  Thank you for taking the time for your Medicare Wellness Visit. I appreciate your continued commitment to your health goals. Please review the care plan we discussed, and feel free to reach out if I can assist you further.  Please note that Annual Wellness Visits do not include a physical exam. Some assessments may be limited, especially if the visit was conducted virtually. If needed, we may recommend an in-person follow-up with your provider.  Ongoing Care Seeing your primary care provider every 3 to 6 months helps us  monitor your health and provide consistent, personalized care.   Dr Amon: 09/22/24 2:20pm, physical Medicare AWV: 07/19/25 3pm, telephone  Referrals If a referral was made during today's visit and you haven't received any updates within two weeks, please contact the referred provider directly to check on the status.  Mammogram ([facility]) due 09/01/24:  [phone]   Recommended Screenings: You will need to get the following vaccines at your local pharmacy: 2nd Shingles   Health Maintenance  Topic Date Due   Medicare Annual Wellness Visit  Never done   Zoster (Shingles) Vaccine (2 of 2) 06/27/2021   COVID-19 Vaccine (4 - 2025-26 season) 07/29/2024*   Breast Cancer Screening  08/31/2025   Colon Cancer Screening  01/18/2026   DTaP/Tdap/Td vaccine (2 - Td or Tdap) 02/18/2028   Pneumococcal Vaccine for age over 24  Completed   Flu Shot  Completed   Osteoporosis screening with Bone Density Scan  Completed   Hepatitis C Screening  Completed   Meningitis B Vaccine  Aged Out  *Topic was postponed. The date shown is not the original due date.       07/06/2024    1:09 PM  Advanced Directives  Does Patient Have a Medical Advance Directive? Yes  Type of Estate Agent of [place];Living will  Does patient want to make changes to medical advance directive? No - Patient declined  Copy of Healthcare Power of Attorney in Chart? Yes -  validated most recent copy scanned in chart (See row information)  Please bring a copy of your health care power of attorney and living will to the office to be added to your chart at your convenience. You can mail a copy to [facility] [address] [place], [place] [address] or email to [email or web address]  Vision: Annual vision screenings are recommended for early detection of glaucoma, cataracts, and diabetic retinopathy. These exams can also reveal signs of chronic conditions such as diabetes and high blood pressure.  Dental: Annual dental screenings help detect early signs of oral cancer, gum disease, and other conditions linked to overall health, including heart disease and diabetes.  Please see the attached documents for additional preventive care recommendations.

## 2024-07-13 NOTE — Telephone Encounter (Signed)
 Pt scheduled cpe for 09/22/24 at 2:20pm. She would like to come in for lab appt the week prior. If appropriate, please place future orders then call pt to schedule lab appt.

## 2024-07-14 ENCOUNTER — Other Ambulatory Visit (HOSPITAL_COMMUNITY): Payer: Self-pay

## 2024-07-14 NOTE — Telephone Encounter (Signed)
 CMP FLP CBC vitamin D  UDS if needed

## 2024-07-14 NOTE — Telephone Encounter (Signed)
 Pt is scheduled

## 2024-07-14 NOTE — Telephone Encounter (Signed)
 Orders placed. Dena- please call Pt and set up lab appt several days before her visit on 09/22/24 please.

## 2024-07-31 ENCOUNTER — Ambulatory Visit (HOSPITAL_BASED_OUTPATIENT_CLINIC_OR_DEPARTMENT_OTHER)
Admission: RE | Admit: 2024-07-31 | Discharge: 2024-07-31 | Disposition: A | Source: Ambulatory Visit | Attending: Internal Medicine | Admitting: Internal Medicine

## 2024-07-31 ENCOUNTER — Inpatient Hospital Stay (HOSPITAL_BASED_OUTPATIENT_CLINIC_OR_DEPARTMENT_OTHER): Admission: RE | Admit: 2024-07-31 | Source: Ambulatory Visit

## 2024-07-31 ENCOUNTER — Encounter (HOSPITAL_BASED_OUTPATIENT_CLINIC_OR_DEPARTMENT_OTHER): Payer: Self-pay

## 2024-09-12 ENCOUNTER — Other Ambulatory Visit

## 2024-09-22 ENCOUNTER — Encounter: Admitting: Internal Medicine

## 2025-07-19 ENCOUNTER — Ambulatory Visit
